# Patient Record
Sex: Female | Born: 1945 | Race: White | Hispanic: No | Marital: Married | Smoking: Former smoker
Health system: Southern US, Community
[De-identification: ages and names within clinical notes are randomized; demographics above are authoritative.]

## PROBLEM LIST (undated history)

## (undated) DIAGNOSIS — L900 Lichen sclerosus et atrophicus: Secondary | ICD-10-CM

## (undated) DIAGNOSIS — E785 Hyperlipidemia, unspecified: Secondary | ICD-10-CM

## (undated) DIAGNOSIS — R011 Cardiac murmur, unspecified: Secondary | ICD-10-CM

## (undated) DIAGNOSIS — E039 Hypothyroidism, unspecified: Secondary | ICD-10-CM

## (undated) DIAGNOSIS — E119 Type 2 diabetes mellitus without complications: Secondary | ICD-10-CM

## (undated) DIAGNOSIS — Z8614 Personal history of Methicillin resistant Staphylococcus aureus infection: Secondary | ICD-10-CM

## (undated) DIAGNOSIS — E2839 Other primary ovarian failure: Secondary | ICD-10-CM

## (undated) DIAGNOSIS — J45909 Unspecified asthma, uncomplicated: Secondary | ICD-10-CM

## (undated) DIAGNOSIS — M1990 Unspecified osteoarthritis, unspecified site: Secondary | ICD-10-CM

## (undated) DIAGNOSIS — I10 Essential (primary) hypertension: Secondary | ICD-10-CM

## (undated) DIAGNOSIS — R2681 Unsteadiness on feet: Secondary | ICD-10-CM

## (undated) DIAGNOSIS — N763 Subacute and chronic vulvitis: Secondary | ICD-10-CM

## (undated) DIAGNOSIS — E7800 Pure hypercholesterolemia, unspecified: Secondary | ICD-10-CM

## (undated) HISTORY — DX: Unspecified asthma, uncomplicated: J45.909

## (undated) HISTORY — DX: Unsteadiness on feet: R26.81

## (undated) HISTORY — DX: Other primary ovarian failure: E28.39

## (undated) HISTORY — DX: Lichen sclerosus et atrophicus: L90.0

## (undated) HISTORY — DX: Unspecified osteoarthritis, unspecified site: M19.90

## (undated) HISTORY — DX: Essential (primary) hypertension: I10

## (undated) HISTORY — DX: Hypothyroidism, unspecified: E03.9

## (undated) HISTORY — PX: CATARACT EXTRACTION: SUR2

## (undated) HISTORY — DX: Pure hypercholesterolemia, unspecified: E78.00

## (undated) HISTORY — DX: Hyperlipidemia, unspecified: E78.5

## (undated) HISTORY — DX: Personal history of Methicillin resistant Staphylococcus aureus infection: Z86.14

## (undated) HISTORY — DX: Type 2 diabetes mellitus without complications: E11.9

## (undated) HISTORY — PX: OTHER SURGICAL HISTORY: SHX169

## (undated) HISTORY — DX: Cardiac murmur, unspecified: R01.1

## (undated) HISTORY — DX: Subacute and chronic vulvitis: N76.3

## (undated) HISTORY — PX: PARTIAL HYSTERECTOMY: SHX80

---

## 2002-09-15 ENCOUNTER — Encounter: Admission: RE | Admit: 2002-09-15 | Discharge: 2002-12-14 | Payer: Self-pay | Admitting: *Deleted

## 2007-06-17 ENCOUNTER — Other Ambulatory Visit: Admission: RE | Admit: 2007-06-17 | Discharge: 2007-06-17 | Payer: Self-pay | Admitting: Obstetrics and Gynecology

## 2010-05-24 ENCOUNTER — Emergency Department (HOSPITAL_COMMUNITY)
Admission: EM | Admit: 2010-05-24 | Discharge: 2010-05-24 | Disposition: A | Discharge status: Home / Self Care | Payer: Medicare Other | Attending: Emergency Medicine | Admitting: Emergency Medicine

## 2010-05-24 ENCOUNTER — Emergency Department (HOSPITAL_COMMUNITY): Payer: Medicare Other

## 2010-05-24 DIAGNOSIS — IMO0002 Reserved for concepts with insufficient information to code with codable children: Secondary | ICD-10-CM | POA: Insufficient documentation

## 2010-05-24 DIAGNOSIS — E119 Type 2 diabetes mellitus without complications: Secondary | ICD-10-CM | POA: Insufficient documentation

## 2010-05-24 DIAGNOSIS — S62319A Displaced fracture of base of unspecified metacarpal bone, initial encounter for closed fracture: Secondary | ICD-10-CM | POA: Insufficient documentation

## 2010-05-24 DIAGNOSIS — M79609 Pain in unspecified limb: Secondary | ICD-10-CM | POA: Insufficient documentation

## 2010-05-24 DIAGNOSIS — Y9289 Other specified places as the place of occurrence of the external cause: Secondary | ICD-10-CM | POA: Insufficient documentation

## 2010-05-24 DIAGNOSIS — W1809XA Striking against other object with subsequent fall, initial encounter: Secondary | ICD-10-CM | POA: Insufficient documentation

## 2010-05-24 DIAGNOSIS — E7800 Pure hypercholesterolemia, unspecified: Secondary | ICD-10-CM | POA: Insufficient documentation

## 2010-05-24 DIAGNOSIS — I10 Essential (primary) hypertension: Secondary | ICD-10-CM | POA: Insufficient documentation

## 2011-11-05 IMAGING — CR DG HAND COMPLETE 3+V*L*
3 series · 3 of 3 positions shown · non-contrast
Comparison: None.

CLINICAL DATA: Trauma, fall, metacarpal pain

LEFT HAND - COMPLETE 3+ VIEW

[x hand ap left]
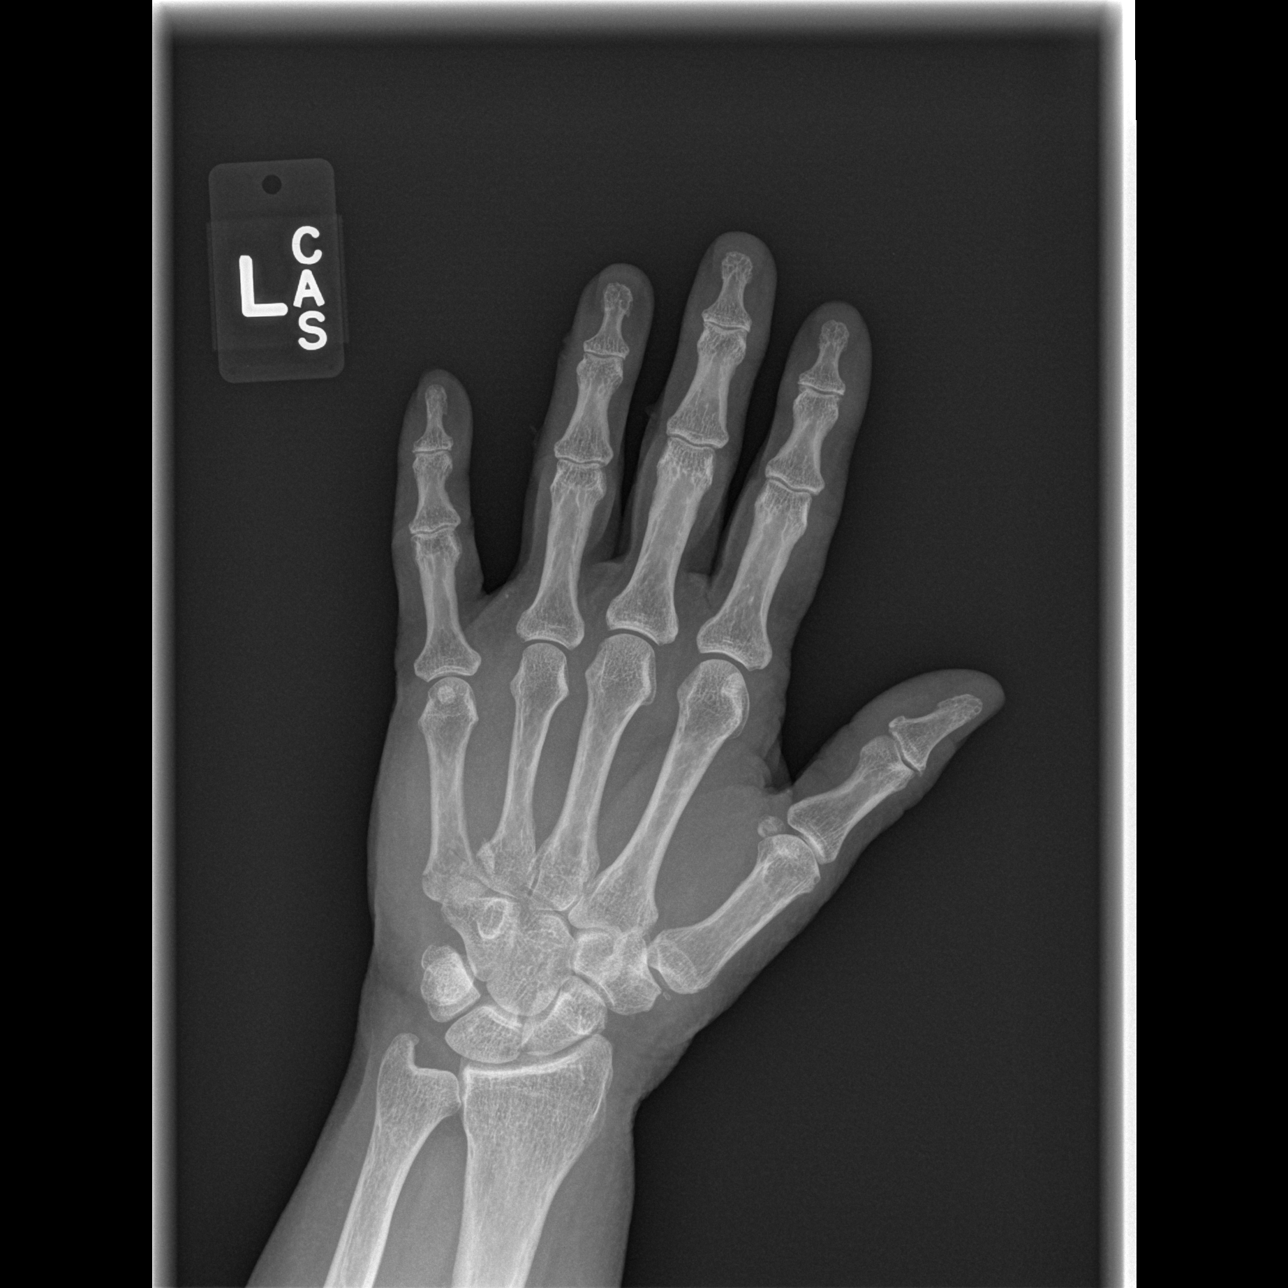

[x hand oblique left]
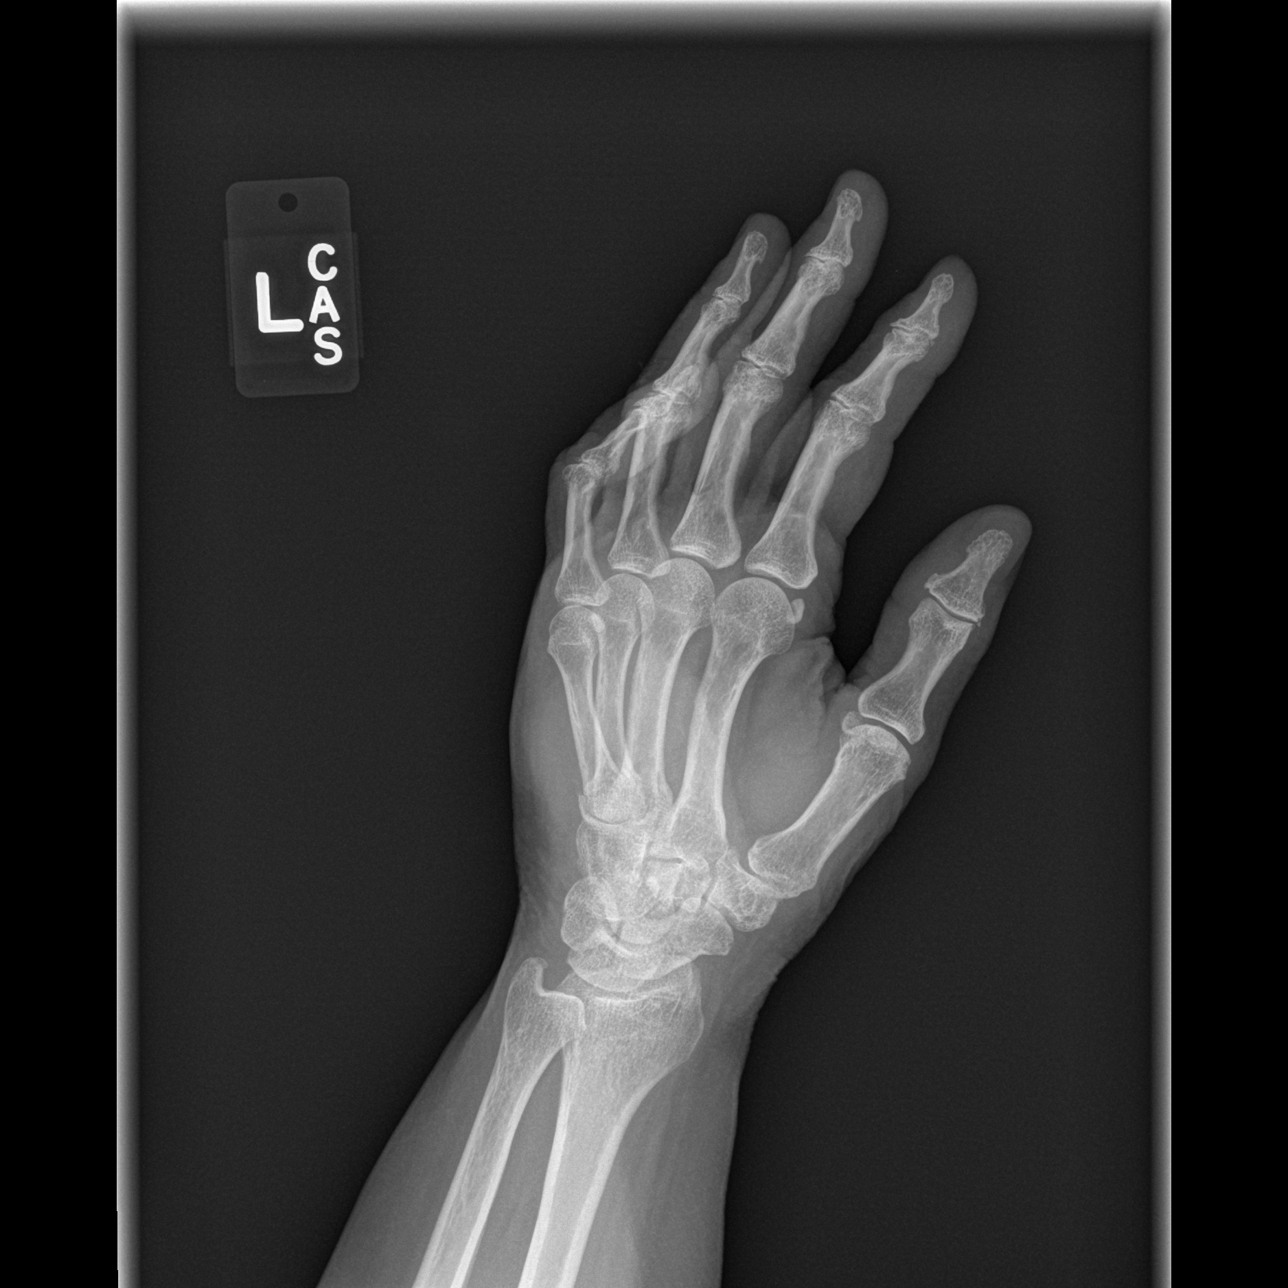

[x hand lat left]
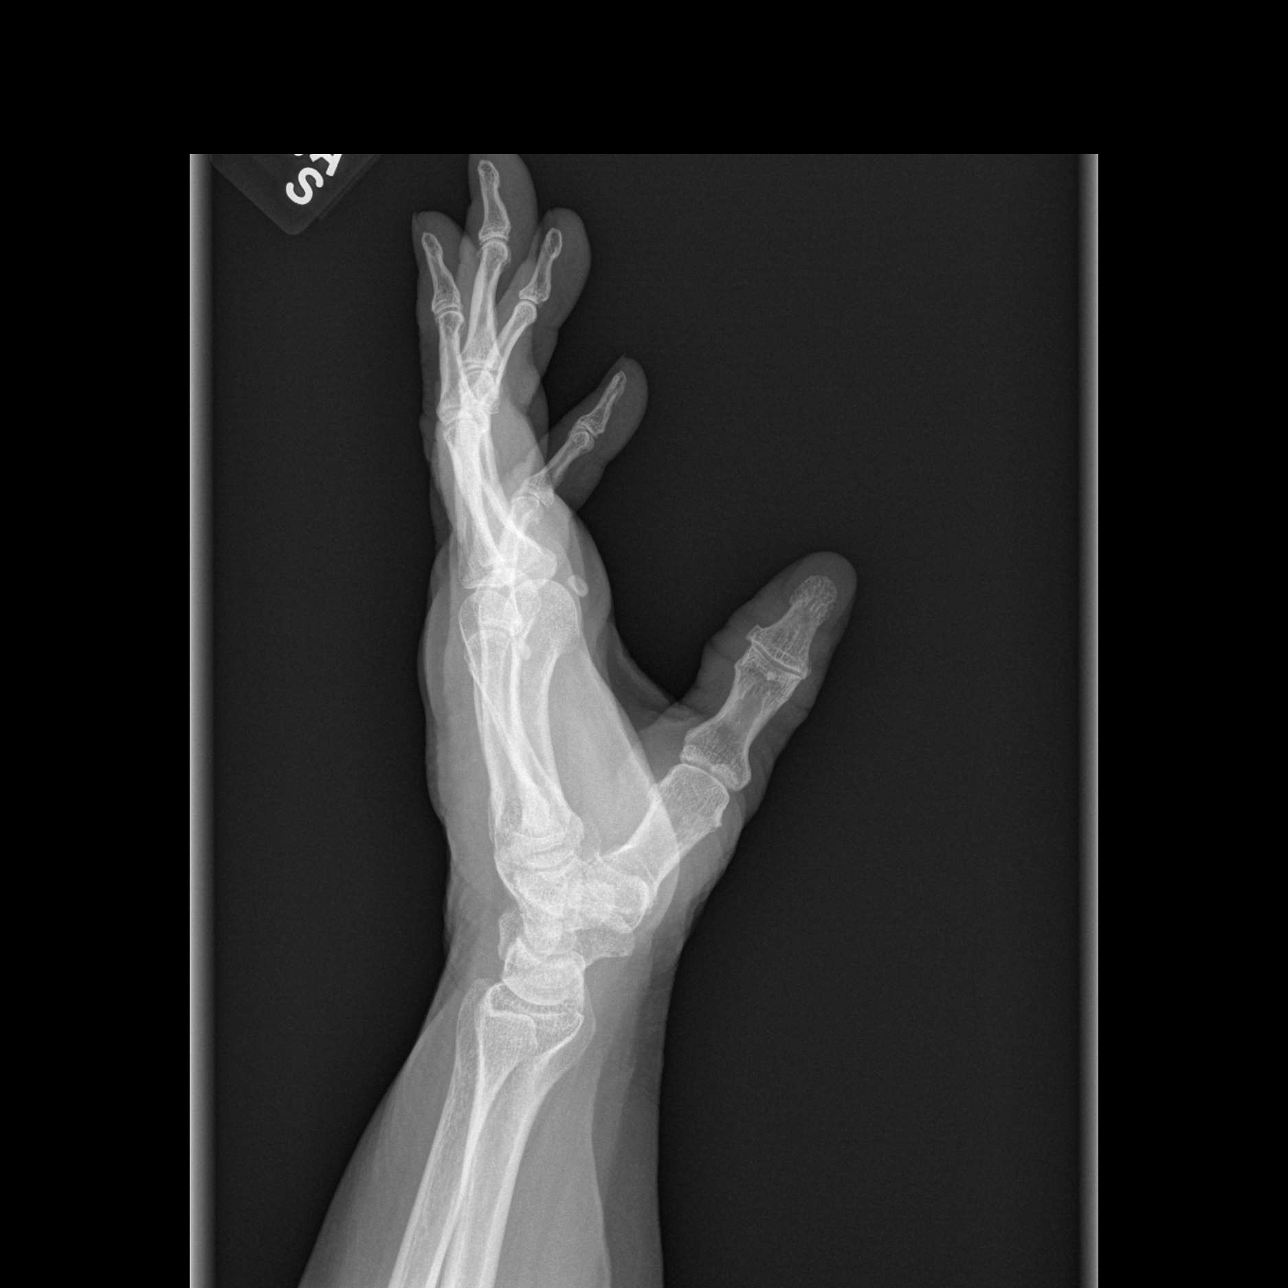

[3 of 3 positions shown; findings below may reference images not displayed]

FINDINGS: There is an acute fracture with minimal displacement of
the left fifth metacarpal proximally at its articulation to the
carpal bones.  Bones appear osteopenic.  No significant
degenerative change.  Swelling noted over the fracture.  No other
acute finding.
IMPRESSION: Proximal left fifth metacarpal fracture.

## 2016-10-23 ENCOUNTER — Telehealth: Payer: Self-pay

## 2016-10-23 NOTE — Telephone Encounter (Signed)
Sent notes to scheduling for appointment.

## 2016-10-29 ENCOUNTER — Telehealth (HOSPITAL_COMMUNITY): Payer: Self-pay | Admitting: Family Medicine

## 2016-10-31 NOTE — Telephone Encounter (Signed)
User: Allison Michael, [name] A Date/time: 10/29/16 10:09 AM  Comment: Called pt and lmsg for her to CB to get scheduled for an echo.   Context:  Outcome: Left Message  Phone number: [phone] Phone Type: Mobile  Comm. type: Telephone Call type: Outgoing  Contact: Nicodemus, Zacaria Relation to patient: Self

## 2016-11-04 ENCOUNTER — Other Ambulatory Visit: Payer: Self-pay

## 2016-11-04 ENCOUNTER — Other Ambulatory Visit: Payer: Self-pay | Admitting: Family Medicine

## 2016-11-04 ENCOUNTER — Ambulatory Visit (HOSPITAL_COMMUNITY): Payer: Medicare Other | Attending: Cardiology

## 2016-11-04 DIAGNOSIS — R011 Cardiac murmur, unspecified: Secondary | ICD-10-CM | POA: Insufficient documentation

## 2016-11-04 DIAGNOSIS — I081 Rheumatic disorders of both mitral and tricuspid valves: Secondary | ICD-10-CM | POA: Diagnosis not present

## 2016-11-04 DIAGNOSIS — I10 Essential (primary) hypertension: Secondary | ICD-10-CM | POA: Diagnosis not present

## 2017-11-05 ENCOUNTER — Other Ambulatory Visit: Payer: Self-pay | Admitting: Family Medicine

## 2017-11-05 DIAGNOSIS — R0989 Other specified symptoms and signs involving the circulatory and respiratory systems: Secondary | ICD-10-CM

## 2017-11-10 ENCOUNTER — Ambulatory Visit
Admission: RE | Admit: 2017-11-10 | Discharge: 2017-11-10 | Disposition: A | Discharge status: Home / Self Care | Payer: Medicare Other | Source: Ambulatory Visit | Attending: Family Medicine | Admitting: Family Medicine

## 2017-11-10 DIAGNOSIS — R0989 Other specified symptoms and signs involving the circulatory and respiratory systems: Secondary | ICD-10-CM

## 2019-02-12 ENCOUNTER — Ambulatory Visit: Payer: Medicare Other

## 2019-02-20 ENCOUNTER — Ambulatory Visit: Payer: Medicare PPO | Attending: Internal Medicine

## 2019-02-20 DIAGNOSIS — Z23 Encounter for immunization: Secondary | ICD-10-CM | POA: Insufficient documentation

## 2019-02-20 NOTE — Progress Notes (Signed)
   Covid-19 Vaccination Clinic  Name:  Allison Michael    MRN: [id number] DOB: [birth year]  02/20/2019  Allison Michael was observed post Covid-19 immunization for 15 minutes without incidence. She was provided with Vaccine Information Sheet and instruction to access the V-Safe system.   Allison Michael was instructed to call 911 with any severe reactions post vaccine: Marland Kitchen Difficulty breathing  . Swelling of your face and throat  . A fast heartbeat  . A bad rash all over your body  . Dizziness and weakness    Immunizations Administered    Name Date Dose VIS Date Route   Pfizer COVID-19 Vaccine 02/20/2019  4:01 PM 0.3 mL 12/25/2018 Intramuscular   Manufacturer: [employer], [employer]   Lot: [id number]   NDC: [address]-5

## 2019-02-23 ENCOUNTER — Ambulatory Visit: Payer: Medicare Other

## 2019-03-17 ENCOUNTER — Ambulatory Visit: Payer: Medicare PPO | Attending: Internal Medicine

## 2019-03-17 DIAGNOSIS — Z23 Encounter for immunization: Secondary | ICD-10-CM | POA: Insufficient documentation

## 2019-03-17 NOTE — Progress Notes (Signed)
   Covid-19 Vaccination Clinic  Name:  Marybelle Giraldo    MRN: [id number] DOB: July 19, 1945  03/17/2019  Ms. Saadeh was observed post Covid-19 immunization for 15 minutes without incident. She was provided with Vaccine Information Sheet and instruction to access the V-Safe system.   Ms. Mavis was instructed to call 911 with any severe reactions post vaccine: Marland Kitchen Difficulty breathing  . Swelling of face and throat  . A fast heartbeat  . A bad rash all over body  . Dizziness and weakness   Immunizations Administered    Name Date Dose VIS Date Route   Pfizer COVID-19 Vaccine 03/17/2019 12:07 PM 0.3 mL 12/25/2018 Intramuscular   Manufacturer: [employer], [employer]   Lot: [id number]   NDC: [address]-9    .

## 2019-09-01 DIAGNOSIS — Z1231 Encounter for screening mammogram for malignant neoplasm of breast: Secondary | ICD-10-CM | POA: Diagnosis not present

## 2019-10-06 DIAGNOSIS — I10 Essential (primary) hypertension: Secondary | ICD-10-CM | POA: Diagnosis not present

## 2019-10-06 DIAGNOSIS — E782 Mixed hyperlipidemia: Secondary | ICD-10-CM | POA: Diagnosis not present

## 2019-10-06 DIAGNOSIS — Z7984 Long term (current) use of oral hypoglycemic drugs: Secondary | ICD-10-CM | POA: Diagnosis not present

## 2019-10-06 DIAGNOSIS — E039 Hypothyroidism, unspecified: Secondary | ICD-10-CM | POA: Diagnosis not present

## 2019-10-06 DIAGNOSIS — E119 Type 2 diabetes mellitus without complications: Secondary | ICD-10-CM | POA: Diagnosis not present

## 2020-03-22 DIAGNOSIS — Z0000 Encounter for general adult medical examination without abnormal findings: Secondary | ICD-10-CM | POA: Diagnosis not present

## 2020-03-22 DIAGNOSIS — E039 Hypothyroidism, unspecified: Secondary | ICD-10-CM | POA: Diagnosis not present

## 2020-03-22 DIAGNOSIS — E782 Mixed hyperlipidemia: Secondary | ICD-10-CM | POA: Diagnosis not present

## 2020-03-22 DIAGNOSIS — E1169 Type 2 diabetes mellitus with other specified complication: Secondary | ICD-10-CM | POA: Diagnosis not present

## 2020-03-22 DIAGNOSIS — I10 Essential (primary) hypertension: Secondary | ICD-10-CM | POA: Diagnosis not present

## 2020-04-04 DIAGNOSIS — I10 Essential (primary) hypertension: Secondary | ICD-10-CM | POA: Diagnosis not present

## 2020-04-04 DIAGNOSIS — E119 Type 2 diabetes mellitus without complications: Secondary | ICD-10-CM | POA: Diagnosis not present

## 2020-04-04 DIAGNOSIS — E782 Mixed hyperlipidemia: Secondary | ICD-10-CM | POA: Diagnosis not present

## 2020-04-04 DIAGNOSIS — E039 Hypothyroidism, unspecified: Secondary | ICD-10-CM | POA: Diagnosis not present

## 2020-04-18 IMAGING — US US CAROTID DUPLEX BILAT
1 series · 13 of 24 positions shown · non-contrast
Comparison: None.

CLINICAL DATA: 72-year-old female with right carotid bruit

EXAM:
BILATERAL CAROTID DUPLEX ULTRASOUND
TECHNIQUE: Gray scale imaging, color Doppler and duplex ultrasound were
performed of bilateral carotid and vertebral arteries in the neck.

[Series 1: us carotid duplex bilat · 0.05mm/px · 13 of 44 slices shown]
[im 1/44]
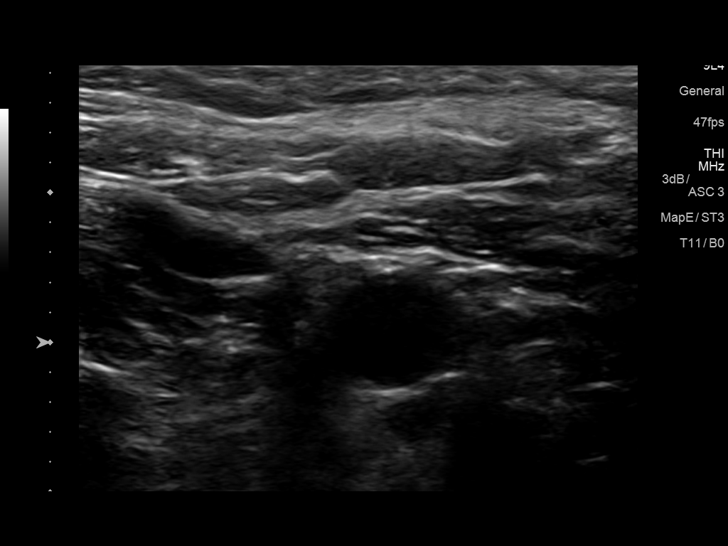
[im 4/44]
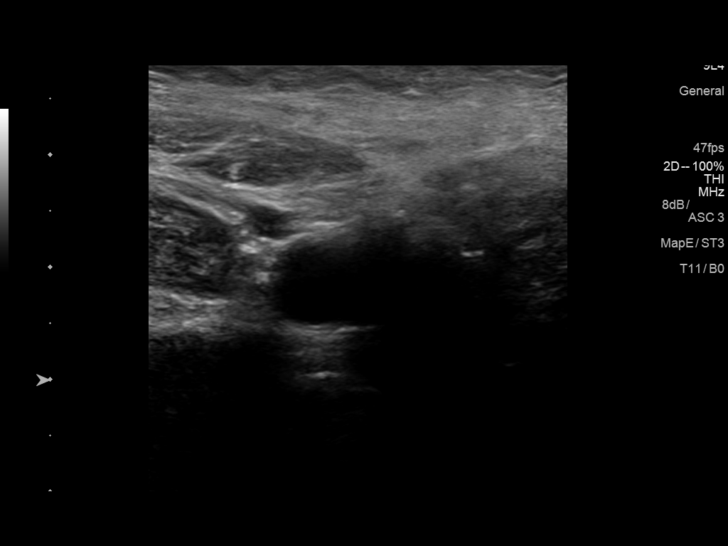
[im 8/44]
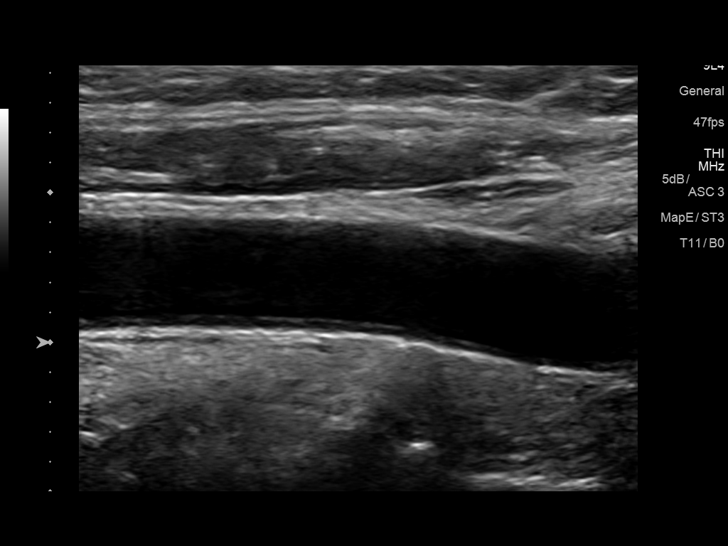
[im 12/44]
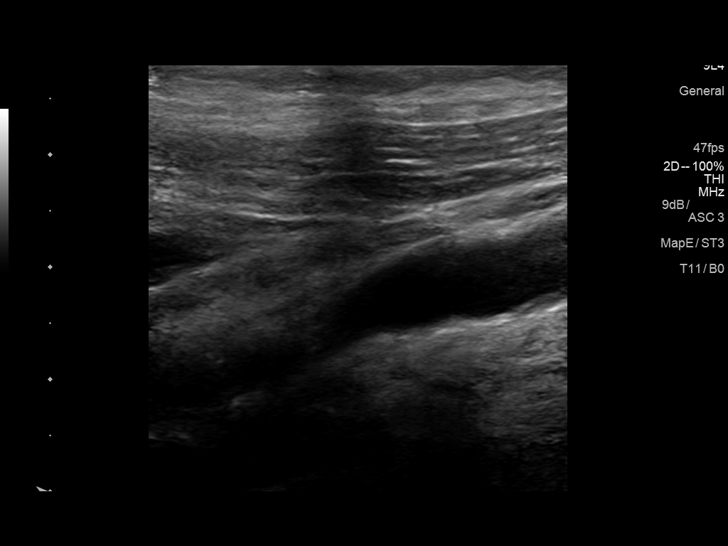
[im 15/44]
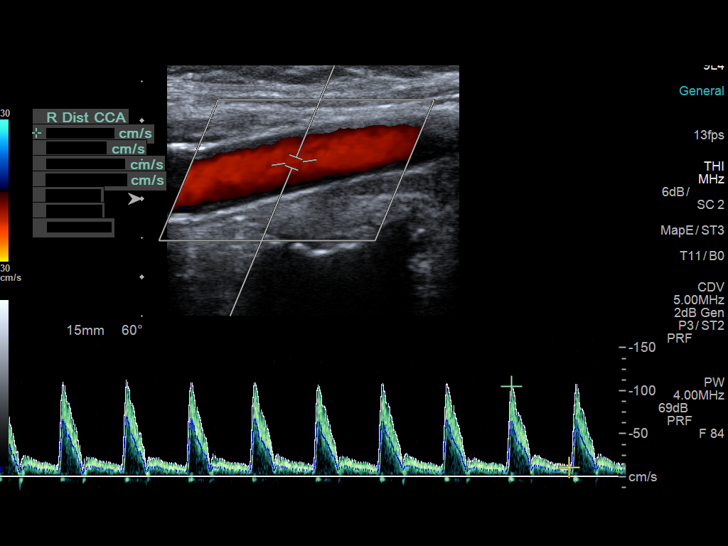
[im 19/44]
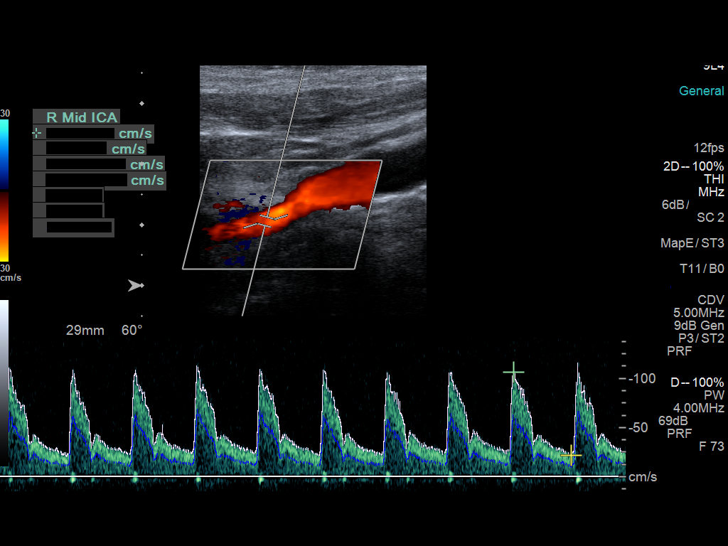
[im 23/44]
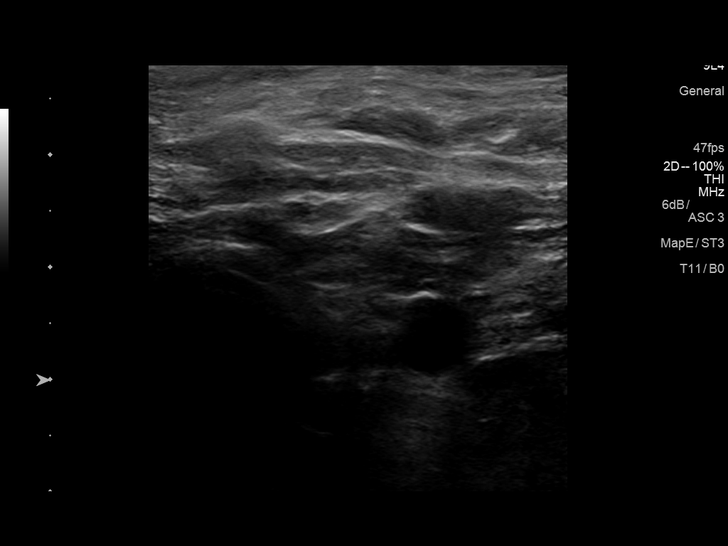
[im 25/44]
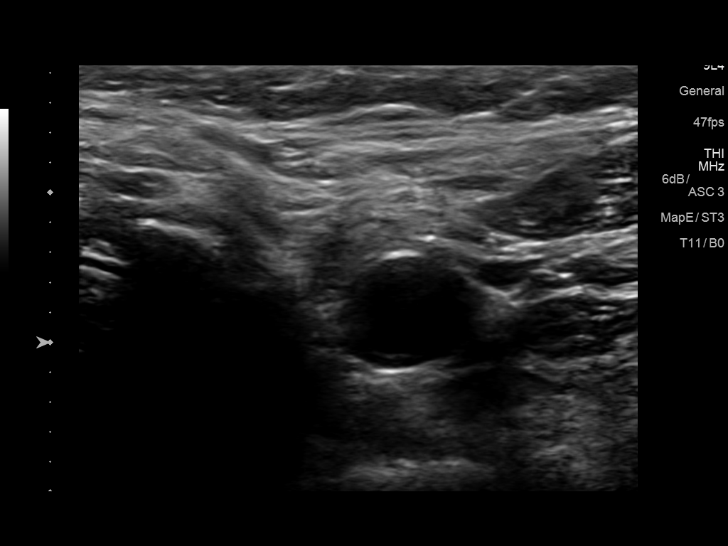
[im 29/44]
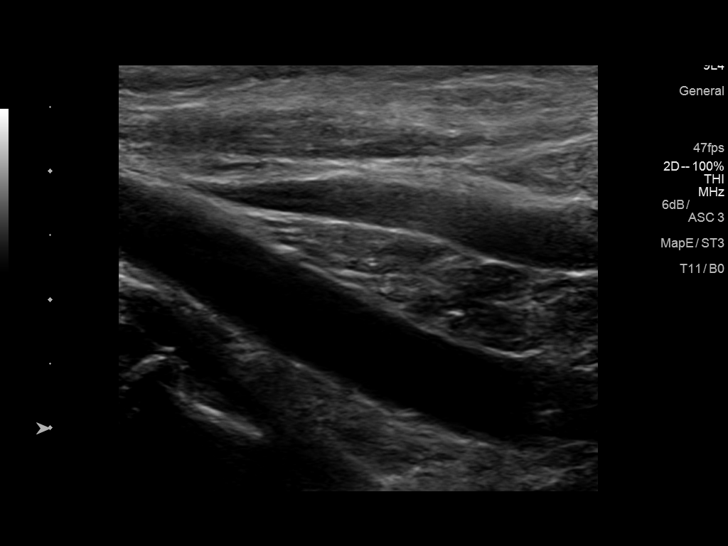
[im 32/44]
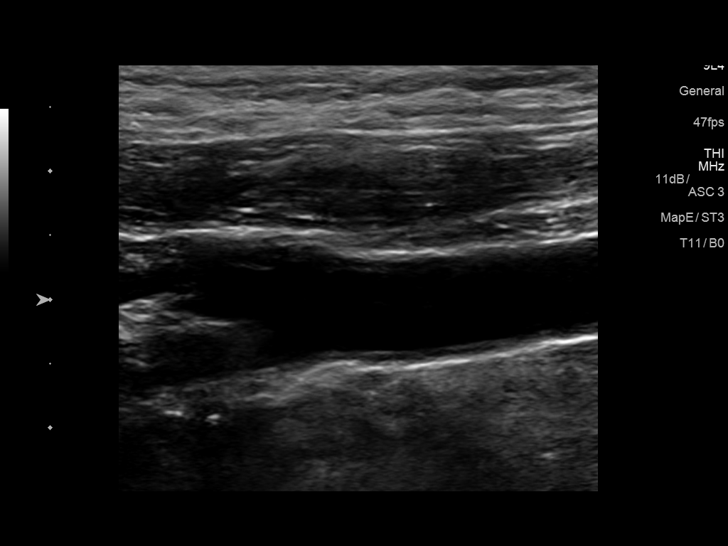
[im 36/44]
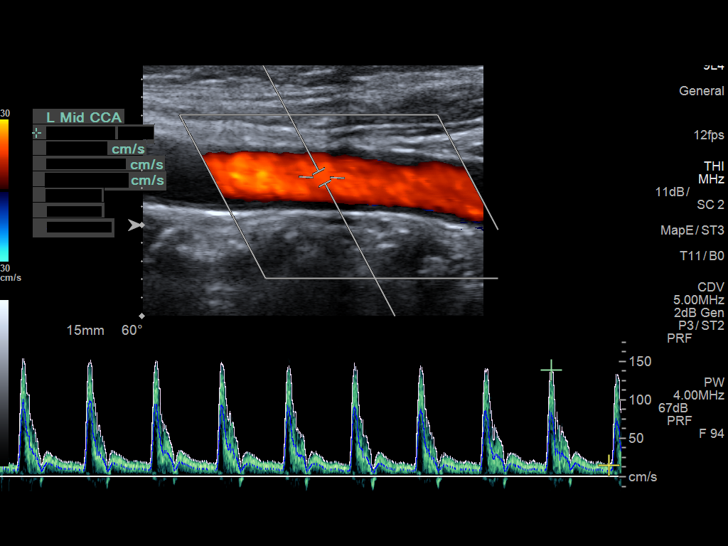
[im 40/44]
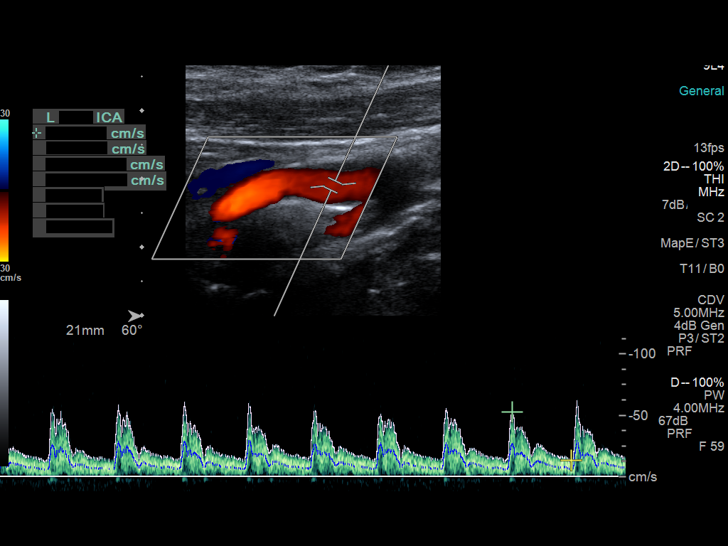
[im 44/44]
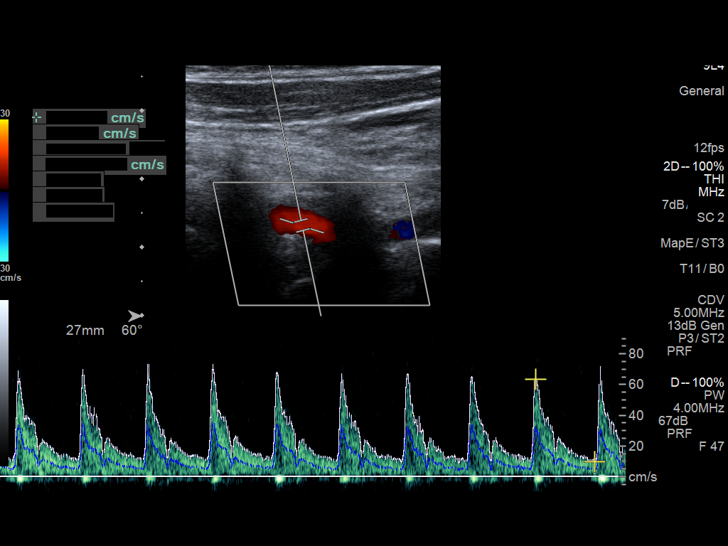

[13 of 24 positions shown; findings below may reference images not displayed]

FINDINGS: Criteria: Quantification of carotid stenosis is based on velocity
parameters that correlate the residual internal carotid diameter
with NASCET-based stenosis levels, using the diameter of the distal
internal carotid lumen as the denominator for stenosis measurement.

The following velocity measurements were obtained:

RIGHT
ICA: 106/22 cm/sec
CCA: 124/10 cm/sec

SYSTOLIC ICA/CCA RATIO:

ECA:  121 cm/sec

LEFT

ICA: 106/23 cm/sec

CCA: 147/11 cm/sec

SYSTOLIC ICA/CCA RATIO:

ECA:  157 cm/sec

RIGHT CAROTID ARTERY: No significant atherosclerotic plaque or
evidence of stenosis.

RIGHT VERTEBRAL ARTERY:  Patent with normal antegrade flow.

LEFT CAROTID ARTERY: No significant atherosclerotic plaque or
evidence of stenosis.

LEFT VERTEBRAL ARTERY:  Patent with normal antegrade flow.
IMPRESSION: 1. No significant atherosclerotic plaque or evidence of stenosis in
either internal carotid artery.
2. Vertebral arteries are patent with normal antegrade flow.

## 2020-05-08 DIAGNOSIS — H40023 Open angle with borderline findings, high risk, bilateral: Secondary | ICD-10-CM | POA: Diagnosis not present

## 2020-05-08 DIAGNOSIS — Z961 Presence of intraocular lens: Secondary | ICD-10-CM | POA: Diagnosis not present

## 2020-05-08 DIAGNOSIS — E119 Type 2 diabetes mellitus without complications: Secondary | ICD-10-CM | POA: Diagnosis not present

## 2020-09-06 DIAGNOSIS — Z1231 Encounter for screening mammogram for malignant neoplasm of breast: Secondary | ICD-10-CM | POA: Diagnosis not present

## 2020-09-06 DIAGNOSIS — Z803 Family history of malignant neoplasm of breast: Secondary | ICD-10-CM | POA: Diagnosis not present

## 2020-09-06 DIAGNOSIS — Z780 Asymptomatic menopausal state: Secondary | ICD-10-CM | POA: Diagnosis not present

## 2020-09-21 DIAGNOSIS — R922 Inconclusive mammogram: Secondary | ICD-10-CM | POA: Diagnosis not present

## 2020-10-05 DIAGNOSIS — E119 Type 2 diabetes mellitus without complications: Secondary | ICD-10-CM | POA: Diagnosis not present

## 2020-10-05 DIAGNOSIS — I10 Essential (primary) hypertension: Secondary | ICD-10-CM | POA: Diagnosis not present

## 2020-10-05 DIAGNOSIS — E039 Hypothyroidism, unspecified: Secondary | ICD-10-CM | POA: Diagnosis not present

## 2020-10-05 DIAGNOSIS — E782 Mixed hyperlipidemia: Secondary | ICD-10-CM | POA: Diagnosis not present

## 2020-10-05 DIAGNOSIS — Z7984 Long term (current) use of oral hypoglycemic drugs: Secondary | ICD-10-CM | POA: Diagnosis not present

## 2021-01-24 DIAGNOSIS — M1990 Unspecified osteoarthritis, unspecified site: Secondary | ICD-10-CM | POA: Diagnosis not present

## 2021-04-05 DIAGNOSIS — I10 Essential (primary) hypertension: Secondary | ICD-10-CM | POA: Diagnosis not present

## 2021-04-05 DIAGNOSIS — E782 Mixed hyperlipidemia: Secondary | ICD-10-CM | POA: Diagnosis not present

## 2021-04-05 DIAGNOSIS — E119 Type 2 diabetes mellitus without complications: Secondary | ICD-10-CM | POA: Diagnosis not present

## 2021-04-05 DIAGNOSIS — E039 Hypothyroidism, unspecified: Secondary | ICD-10-CM | POA: Diagnosis not present

## 2021-05-02 DIAGNOSIS — M25511 Pain in right shoulder: Secondary | ICD-10-CM | POA: Diagnosis not present

## 2021-05-09 DIAGNOSIS — E119 Type 2 diabetes mellitus without complications: Secondary | ICD-10-CM | POA: Diagnosis not present

## 2021-05-09 DIAGNOSIS — Z961 Presence of intraocular lens: Secondary | ICD-10-CM | POA: Diagnosis not present

## 2021-05-09 DIAGNOSIS — H40013 Open angle with borderline findings, low risk, bilateral: Secondary | ICD-10-CM | POA: Diagnosis not present

## 2021-05-23 DIAGNOSIS — E119 Type 2 diabetes mellitus without complications: Secondary | ICD-10-CM | POA: Diagnosis not present

## 2021-05-23 DIAGNOSIS — I10 Essential (primary) hypertension: Secondary | ICD-10-CM | POA: Diagnosis not present

## 2021-05-23 DIAGNOSIS — E039 Hypothyroidism, unspecified: Secondary | ICD-10-CM | POA: Diagnosis not present

## 2021-05-23 DIAGNOSIS — E7800 Pure hypercholesterolemia, unspecified: Secondary | ICD-10-CM | POA: Diagnosis not present

## 2021-05-23 DIAGNOSIS — R011 Cardiac murmur, unspecified: Secondary | ICD-10-CM | POA: Diagnosis not present

## 2021-05-23 DIAGNOSIS — Z0000 Encounter for general adult medical examination without abnormal findings: Secondary | ICD-10-CM | POA: Diagnosis not present

## 2021-05-28 ENCOUNTER — Telehealth: Payer: Self-pay

## 2021-05-28 NOTE — Telephone Encounter (Signed)
NOTE SCANNED TO REFERRAL ?

## 2021-07-02 ENCOUNTER — Encounter: Payer: Self-pay | Admitting: Cardiovascular Disease

## 2021-07-02 ENCOUNTER — Ambulatory Visit: Payer: Medicare PPO | Admitting: Cardiovascular Disease

## 2021-07-02 VITALS — BP 160/70 | HR 89 | Ht 61.0 in | Wt 188.2 lb

## 2021-07-02 DIAGNOSIS — Z79899 Other long term (current) drug therapy: Secondary | ICD-10-CM

## 2021-07-02 DIAGNOSIS — I340 Nonrheumatic mitral (valve) insufficiency: Secondary | ICD-10-CM | POA: Diagnosis not present

## 2021-07-02 DIAGNOSIS — I350 Nonrheumatic aortic (valve) stenosis: Secondary | ICD-10-CM | POA: Diagnosis not present

## 2021-07-02 MED ORDER — HYDROCHLOROTHIAZIDE 25 MG PO TABS: 25.0000 mg | ORAL_TABLET | Freq: Every day | ORAL | 3 refills | Status: DC

## 2021-07-02 NOTE — Patient Instructions (Signed)
Medication Instructions:  START Hydrochlorothiazide 25mg  daily *If you need a refill on your cardiac medications before your next appointment, please call your pharmacy*   Lab Work: BMET in 3 weeks (at nurse visit) If you have labs (blood work) drawn today and your tests are completely normal, you will receive your results only by: MyChart Message (if you have MyChart) OR A paper copy in the mail If you have any lab test that is abnormal or we need to change your treatment, we will call you to review the results.   Testing/Procedures: ECHO Your physician has requested that you have an echocardiogram. Echocardiography is a painless test that uses sound waves to create images of your heart. It provides your doctor with information about the size and shape of your heart and how well your heart's chambers and valves are working. This procedure takes approximately one hour. There are no restrictions for this procedure.  Nurse visit in 3 weeks for BP check (BMET same day)   Follow-Up: At [facility], you and your health needs are our priority.  As part of our continuing mission to provide you with exceptional heart care, we have created designated Provider Care Teams.  These Care Teams include your primary Cardiologist (physician) and Advanced Practice Providers (APPs -  Physician Assistants and Nurse Practitioners) who all work together to provide you with the care you need, when you need it.   Your next appointment:   3 month(s)  The format for your next appointment:   In Person  Provider:1}  Nahser  Important Information About Sugar

## 2021-07-02 NOTE — Progress Notes (Signed)
Cardiology Office Note:    Date:  07/02/2021   ID:  Allison Michael, DOB [birth year], MRN [id number]  PCP:  Juluis Rainier, MD (Inactive)   [facility] Providers Cardiologist:  Ilyas Lipsitz    Referring MD: Clayborn Heron, MD   Chief Complaint  Patient presents with   Heart Murmur    History of Present Illness:    Allison Michael is a 76 y.o. female with a hx of murmur , HTN, HLD Has not been taking her BP regularly .  Watches her salt  Mostly watches her salt   Tries to exercise, Stays active Has been having some issues in her rotator cuff - does not sleep well due to the pain   Past Medical History:  Diagnosis Date   Asthma    Chronic vulvitis    DM (diabetes mellitus) (HCC)    Estrogen deficiency    Gait instability    Heart murmur    History of MRSA infection    HTN (hypertension)    Hypercholesteremia    Hyperlipidemia    Hypothyroidism    Lichen sclerosus    Osteoarthritis    Personal history of Methicillin resistant Staphylococcus aureus infection     Past Surgical History:  Procedure Laterality Date   CATARACT EXTRACTION Bilateral    CHOLECYTECTOMY     PARTIAL HYSTERECTOMY      Current Medications: Current Meds  Medication Sig   acetaminophen (TYLENOL) 500 MG tablet Take 500 mg by mouth every 6 (six) hours as needed.   amLODipine (NORVASC) 10 MG tablet Take 10 mg by mouth daily.   aspirin EC 81 MG tablet Take 81 mg by mouth daily. Swallow whole.   atorvastatin (LIPITOR) 40 MG tablet Take 40 mg by mouth daily.   Dulaglutide (TRULICITY) 3 MG/0.5ML SOPN Inject into the skin.   empagliflozin (JARDIANCE) 25 MG TABS tablet Take by mouth daily.   Fluticasone Furoate 50 MCG/ACT AEPB Inhale into the lungs.   levothyroxine (SYNTHROID) 75 MCG tablet Take 75 mcg by mouth daily before breakfast.   losartan (COZAAR) 100 MG tablet Take 100 mg by mouth daily.   miconazole (MICOTIN) 2 % cream Apply 1 application  topically 2 (two) times daily.   Multiple  Vitamins-Minerals (CENTRUM SILVER PO) Take by mouth.   naproxen sodium (ALEVE) 220 MG tablet Take 220 mg by mouth.   nystatin-triamcinolone (MYCOLOG II) cream Apply 1 application  topically 2 (two) times daily.   Potassium Chloride ER 20 MEQ TBCR Take 1 tablet by mouth daily.     Allergies:   Patient has no allergy information on record.   Social History   Socioeconomic History   Marital status: Married    Spouse name: Not on file   Number of children: Not on file   Years of education: Not on file   Highest education level: Not on file  Occupational History   Not on file  Tobacco Use   Smoking status: Former    Types: Cigarettes   Smokeless tobacco: Never  Substance and Sexual Activity   Alcohol use: Not on file   Drug use: Not on file   Sexual activity: Not on file  Other Topics Concern   Not on file  Social History Narrative   Not on file   Social Determinants of Health   Financial Resource Strain: Not on file  Food Insecurity: Not on file  Transportation Needs: Not on file  Physical Activity: Not on file  Stress: Not on file  Social  Connections: Not on file     Family History: The patient's family history includes CAD in her brother; Diabetes in her father; Heart attack in her brother; Heart disease in her father and mother; Hypertension in her father; Stomach cancer in her mother; Uterine cancer in her mother.  ROS:   Please see the history of present illness.     All other systems reviewed and are negative.  EKGs/Labs/Other Studies Reviewed:    The following studies were reviewed today:   EKG:     Recent Labs: No results found for requested labs within last 365 days.  Recent Lipid Panel No results found for: "CHOL", "TRIG", "HDL", "CHOLHDL", "VLDL", "LDLCALC", "LDLDIRECT"   Risk Assessment/Calculations:           Physical Exam:    VS:  Pulse 89   Ht 5\' 1"  (1.549 m)   Wt 188 lb 3.2 oz (85.4 kg)   SpO2 98%   BMI 35.56 kg/m     Wt Readings  from Last 3 Encounters:  07/02/21 188 lb 3.2 oz (85.4 kg)     GEN: midly obese , elderly female,  well developed in no acute distress HEENT: Normal NECK: No JVD; she has radiation of her systolic murmur up into her right carotid. LYMPHATICS: No lymphadenopathy CARDIAC: Regular rate S1-S2.  She has a 2/6 systolic murmur radiating to the upper right sternal border and into her carotid.  She also has a systolic murmur that radiates out to her left axilla. RESPIRATORY:  Clear to auscultation without rales, wheezing or rhonchi  ABDOMEN: Soft, non-tender, non-distended MUSCULOSKELETAL:  No edema; No deformity  SKIN: Warm and dry NEUROLOGIC:  Alert and oriented x 3 PSYCHIATRIC:  Normal affect   ASSESSMENT:    No diagnosis found. PLAN:    In order of problems listed above:  Murmur : Care presents with 3 different heart murmurs.  She has an aortic stenosis murmur, mitral vegetation murmur and a tricuspid regurgitation murmur.  We will get an echocardiogram for further evaluation of this.  Her last echocardiogram in 2018 showed mild MR and mild TR.  It did not mention any abnormality of the aortic valve but I would suspect that we will find some calcification or sclerosis and perhaps aortic stenosis.  2.  Chronic diastolic CHF: She had grade 1 diastolic CHF at her last echo.  I encouraged her to work on diet, exercise, weight loss.  Also better control of her blood pressure will help with this.  3.  Hypertension: Blood pressure is elevated today.  We will add HCTZ 25 mg a day.  We will have her return for nurse visit in 2 to [redacted] weeks along with a basic metabolic profile.  If her blood pressure remains elevated we will consider adding Bystolic at that time.           Medication Adjustments/Labs and Tests Ordered: Current medicines are reviewed at length with the patient today.  Concerns regarding medicines are outlined above.  No orders of the defined types were placed in this  encounter.  No orders of the defined types were placed in this encounter.   There are no Patient Instructions on file for this visit.   Signed, 2019, MD  07/02/2021 8:58 AM    [facility]

## 2021-07-26 ENCOUNTER — Other Ambulatory Visit: Payer: Medicare PPO

## 2021-07-26 ENCOUNTER — Ambulatory Visit (HOSPITAL_COMMUNITY): Payer: Medicare PPO | Attending: Internal Medicine

## 2021-07-26 ENCOUNTER — Ambulatory Visit: Payer: Medicare PPO

## 2021-07-26 DIAGNOSIS — I340 Nonrheumatic mitral (valve) insufficiency: Secondary | ICD-10-CM

## 2021-07-26 DIAGNOSIS — Z5181 Encounter for therapeutic drug level monitoring: Secondary | ICD-10-CM | POA: Diagnosis not present

## 2021-07-26 DIAGNOSIS — I10 Essential (primary) hypertension: Secondary | ICD-10-CM | POA: Diagnosis not present

## 2021-07-26 DIAGNOSIS — I350 Nonrheumatic aortic (valve) stenosis: Secondary | ICD-10-CM

## 2021-07-26 DIAGNOSIS — Z79899 Other long term (current) drug therapy: Secondary | ICD-10-CM

## 2021-07-26 LAB — ECHOCARDIOGRAM COMPLETE
AR max vel: 1.31 cm2
AV Area VTI: 1.3 cm2
AV Area mean vel: 1.25 cm2
AV Mean grad: 12.2 mmHg
AV Peak grad: 21.2 mmHg
Ao pk vel: 2.3 m/s
Area-P 1/2: 3.37 cm2
S' Lateral: 2.3 cm

## 2021-07-26 LAB — BASIC METABOLIC PANEL
BUN/Creatinine Ratio: 19 (ref 12–28)
BUN: 15 mg/dL (ref 8–27)
CO2: 21 mmol/L (ref 20–29)
Calcium: 10 mg/dL (ref 8.7–10.3)
Chloride: 99 mmol/L (ref 96–106)
Creatinine, Ser: 0.78 mg/dL (ref 0.57–1.00)
Glucose: 217 mg/dL — ABNORMAL HIGH (ref 70–99)
Potassium: 4.4 mmol/L (ref 3.5–5.2)
Sodium: 137 mmol/L (ref 134–144)
eGFR: 79 mL/min/{1.73_m2} (ref >59)

## 2021-07-26 NOTE — Patient Instructions (Signed)
Medication Instructions:  Your physician recommends that you continue on your current medications as directed. Please refer to the Current Medication list given to you today. Will talk with Dr Elease Hashimoto on Friday 07/27/21.   *If you need a refill on your cardiac medications before your next appointment, please call your pharmacy*   Lab Work: BMET done today  If you have labs (blood work) drawn today and your tests are completely normal, you will receive your results only by: MyChart Message (if you have MyChart) OR A paper copy in the mail If you have any lab test that is abnormal or we need to change your treatment, we will call you to review the results.   Testing/Procedures:    Follow-Up: At [facility], you and your health needs are our priority.  As part of our continuing mission to provide you with exceptional heart care, we have created designated Provider Care Teams.  These Care Teams include your primary Cardiologist (physician) and Advanced Practice Providers (APPs -  Physician Assistants and Nurse Practitioners) who all work together to provide you with the care you need, when you need it.  We recommend signing up for the patient portal called "MyChart".  Sign up information is provided on this After Visit Summary.  MyChart is used to connect with patients for Virtual Visits (Telemedicine).  Patients are able to view lab/test results, encounter notes, upcoming appointments, etc.  Non-urgent messages can be sent to your provider as well.   To learn more about what you can do with MyChart, go to [email or web address].     Important Information About Sugar

## 2021-07-26 NOTE — Progress Notes (Signed)
   Nurse Visit   Date of Encounter: 07/26/2021 ID: Allison Michael, DOB 06-02-1945, MRN [id number]  PCP:  Juluis Rainier, MD (Inactive)   [facility] Providers Cardiologist:  Kristeen Miss, MD {   Visit Details   VS:  BP 140/78 (BP Location: Left Arm, Patient Position: Sitting, Cuff Size: Normal)   Pulse 88  , BMI There is no height or weight on file to calculate BMI.  Wt Readings from Last 3 Encounters:  07/02/21 188 lb 3.2 oz (85.4 kg)     Reason for visit: BP Check 2 weeks after starting HCTZ Performed today: Vitals Changes (medications, testing, etc.) : no changes today  Length of Visit: 15 minutes  Pt says she has been feeling well... her BP today 140/78 and HR 88... I have talked with Dr Ladona Ridgel and will plan for Dr Elease Hashimoto to review when he is here in the office... he has a plan in place for her BP management as noted at her last OV. She had an Echo and BMET today.   Pt is asking if Dr Elease Hashimoto can send in all of her Cardio meds for profile so that she can refill easily and will be sent to our office for refills in the future since as of now her refill requests are going to her PCP that has retired. I advised her that we will wait until tomorrow after he has a chance to determine if he is making any changes.   Pt also asking if Dr Elease Hashimoto can suggest a new PCP in the area for her and her husband... she prefers a female and he prefers a female... I advised her that I will forward to Dr Elease Hashimoto for his recommendations.      Medications Adjustments/Labs and Tests Ordered: No orders of the defined types were placed in this encounter.  No orders of the defined types were placed in this encounter.    Signed, Bertram Millard, RN  07/26/2021 10:59 AM

## 2021-07-31 ENCOUNTER — Telehealth: Payer: Self-pay

## 2021-07-31 MED ORDER — POTASSIUM CHLORIDE ER 20 MEQ PO TBCR: 1.0000 | EXTENDED_RELEASE_TABLET | Freq: Every day | ORAL | 3 refills | Status: DC

## 2021-07-31 MED ORDER — AMLODIPINE BESYLATE 10 MG PO TABS: 10.0000 mg | ORAL_TABLET | Freq: Every day | ORAL | 3 refills | Status: DC

## 2021-07-31 MED ORDER — NEBIVOLOL HCL 2.5 MG PO TABS: 2.5000 mg | ORAL_TABLET | Freq: Every day | ORAL | 3 refills | Status: DC

## 2021-07-31 MED ORDER — LOSARTAN POTASSIUM 100 MG PO TABS: 100.0000 mg | ORAL_TABLET | Freq: Every day | ORAL | 3 refills | Status: DC

## 2021-07-31 MED ORDER — ATORVASTATIN CALCIUM 40 MG PO TABS: 40.0000 mg | ORAL_TABLET | Freq: Every day | ORAL | 3 refills | Status: DC

## 2021-07-31 NOTE — Telephone Encounter (Signed)
Left message for patient. Asked that she call office with questions. Medication sent to [facility] on file.

## 2021-07-31 NOTE — Telephone Encounter (Signed)
-----   Message from Philip J Nahser, MD sent at 07/27/2021  5:35 PM EDT ----- Lets add Bystolic 2.5 mg a day  She should continue to monitor her BP   PN  ----- Message ----- From: Bourn, Ann M, RN Sent: 07/26/2021  11:09 AM EDT To: Gregg W Taylor, MD; Philip J Nahser, MD   

## 2021-07-31 NOTE — Telephone Encounter (Signed)
-----   Message from Vesta Mixer, MD sent at 07/27/2021  5:35 PM EDT ----- Joen Laura add Bystolic 2.5 mg a day  She should continue to monitor her BP   PN  ----- Message ----- From: Bertram Millard, RN Sent: 07/26/2021  11:09 AM EDT To: Marinus Maw, MD; Vesta Mixer, MD

## 2021-07-31 NOTE — Telephone Encounter (Signed)
Per note by Jeannett Senior on 07/26/21 at nurse visit:  Pt is asking if Dr Elease Hashimoto can send in all of her Cardio meds for profile so that she can refill easily and will be sent to our office for refills in the future since as of now her refill requests are going to her PCP that has retired.   Dr Elease Hashimoto is okay with this. Refilling Norvasc, lipitor, potassium, and losartan at this time. Sent to [facility] on file.

## 2021-10-01 ENCOUNTER — Encounter: Payer: Self-pay | Admitting: Cardiovascular Disease

## 2021-10-01 NOTE — Progress Notes (Unsigned)
Cardiology Office Note:    Date:  10/02/2021   ID:  Allison Michael, DOB [birth year], MRN [id number]  PCP:  Juluis Rainier, MD (Inactive)   [facility] Providers Cardiologist:  Dejah Droessler    Referring MD: No ref. provider found   Chief Complaint  Patient presents with   Heart Murmur   Hypertension         History of Present Illness:    Allison Michael is a 76 y.o. female with a hx of murmur , HTN, HLD Has not been taking her BP regularly .  Watches her salt  Mostly watches her salt   Tries to exercise, Stays active Has been having some issues in her rotator cuff - does not sleep well due to the pain   Sept. 19, 2023 Allison Michael is seen today for follow up of her hear murmur, HTN, HLD  Echo :  normal LV systolic function, grade I DD, mild AI  She is now going to the [facility] and swims for an hour a day 3 days a week . Is eating better   Wt is 181 lbs ( down 7 lbs )   Dr. Sharl Ma manages her lipids .    Past Medical History:  Diagnosis Date   Asthma    Chronic vulvitis    DM (diabetes mellitus) (HCC)    Estrogen deficiency    Gait instability    Heart murmur    History of MRSA infection    HTN (hypertension)    Hypercholesteremia    Hyperlipidemia    Hypothyroidism    Lichen sclerosus    Osteoarthritis    Personal history of Methicillin resistant Staphylococcus aureus infection     Past Surgical History:  Procedure Laterality Date   CATARACT EXTRACTION Bilateral    CHOLECYTECTOMY     PARTIAL HYSTERECTOMY      Current Medications: Current Meds  Medication Sig   ACCU-CHEK GUIDE test strip daily.   acetaminophen (TYLENOL) 500 MG tablet Take 500 mg by mouth every 6 (six) hours as needed.   amLODipine (NORVASC) 10 MG tablet Take 1 tablet (10 mg total) by mouth daily.   aspirin EC 81 MG tablet Take 81 mg by mouth daily. Swallow whole.   atorvastatin (LIPITOR) 40 MG tablet Take 1 tablet (40 mg total) by mouth daily.   Dulaglutide (TRULICITY) 3 MG/0.5ML SOPN Inject  into the skin.   empagliflozin (JARDIANCE) 25 MG TABS tablet Take by mouth daily.   Fluticasone Furoate 50 MCG/ACT AEPB Inhale into the lungs.   hydrochlorothiazide (HYDRODIURIL) 25 MG tablet Take 1 tablet (25 mg total) by mouth daily.   levothyroxine (SYNTHROID) 75 MCG tablet Take 75 mcg by mouth daily before breakfast.   losartan (COZAAR) 100 MG tablet Take 1 tablet (100 mg total) by mouth daily.   miconazole (MICOTIN) 2 % cream Apply 1 application  topically 2 (two) times daily.   Multiple Vitamins-Minerals (CENTRUM SILVER PO) Take by mouth.   naproxen sodium (ALEVE) 220 MG tablet Take 220 mg by mouth.   nystatin-triamcinolone (MYCOLOG II) cream Apply 1 application  topically 2 (two) times daily.   Potassium Chloride ER 20 MEQ TBCR Take 1 tablet by mouth daily.   [DISCONTINUED] nebivolol (BYSTOLIC) 2.5 MG tablet Take 1 tablet (2.5 mg total) by mouth daily.     Allergies:   Patient has no allergy information on record.   Social History   Socioeconomic History   Marital status: Married    Spouse name: Not on file  Number of children: Not on file   Years of education: Not on file   Highest education level: Not on file  Occupational History   Not on file  Tobacco Use   Smoking status: Former    Types: Cigarettes   Smokeless tobacco: Never  Substance and Sexual Activity   Alcohol use: Not on file   Drug use: Not on file   Sexual activity: Not on file  Other Topics Concern   Not on file  Social History Narrative   Not on file   Social Determinants of Health   Financial Resource Strain: Not on file  Food Insecurity: Not on file  Transportation Needs: Not on file  Physical Activity: Not on file  Stress: Not on file  Social Connections: Not on file     Family History: The patient's family history includes CAD in her brother; Diabetes in her father; Heart attack in her brother; Heart disease in her father and mother; Hypertension in her father; Stomach cancer in her  mother; Uterine cancer in her mother.  ROS:   Please see the history of present illness.     All other systems reviewed and are negative.  EKGs/Labs/Other Studies Reviewed:    The following studies were reviewed today:   EKG:     Recent Labs: 07/26/2021: BUN 15; Creatinine, Ser 0.78; Potassium 4.4; Sodium 137  Recent Lipid Panel No results found for: "CHOL", "TRIG", "HDL", "CHOLHDL", "VLDL", "LDLCALC", "LDLDIRECT"   Risk Assessment/Calculations:           Physical Exam:    Physical Exam: Blood pressure (!) 145/62, pulse 95, height 5' 1.5" (1.562 m), weight 181 lb 6.4 oz (82.3 kg), SpO2 96 %.  HYPERTENSION CONTROL Vitals:   10/02/21 0947 10/02/21 1013  BP: (!) 148/74 (!) 145/62    The patient's blood pressure is elevated above target today.  In order to address the patient's elevated BP: A current anti-hypertensive medication was adjusted today.       GEN:  Well nourished, well developed in no acute distress HEENT: Normal NECK: No JVD; No carotid bruits LYMPHATICS: No lymphadenopathy CARDIAC: RRR  soft systolic murmur  RESPIRATORY:  Clear to auscultation without rales, wheezing or rhonchi  ABDOMEN: Soft, non-tender, non-distended MUSCULOSKELETAL:  No edema; No deformity  SKIN: Warm and dry NEUROLOGIC:  Alert and oriented x 3   ASSESSMENT:    No diagnosis found. PLAN:      Murmur : She has mild aortic insufficiency.  She is not having any symptoms.  She is exercising regularly at the [facility].   2.  Chronic diastolic CHF: She is doing well.  She is been losing weight.  She is feeling quite a bit better.  3.  Hypertension: Blood pressure is mildly elevated.  We will increase the Bystolic to 5 mg a day.  She will continue with her weight loss efforts.           Medication Adjustments/Labs and Tests Ordered: Current medicines are reviewed at length with the patient today.  Concerns regarding medicines are outlined above.  No orders  of the defined types were placed in this encounter.  Meds ordered this encounter  Medications   nebivolol (BYSTOLIC) 5 MG tablet    Sig: Take 1 tablet (5 mg total) by mouth daily.    Dispense:  90 tablet    Refill:  3    Patient Instructions  Medication Instructions:  INCREASE NEBIVOLOL TO 5 MG EVERY DAY  *If you  need a refill on your cardiac medications before your next appointment, please call your pharmacy*   Lab Work: NONE If you have labs (blood work) drawn today and your tests are completely normal, you will receive your results only by: [place] (if you have MyChart) OR A paper copy in the mail If you have any lab test that is abnormal or we need to change your treatment, we will call you to review the results.   Testing/Procedures: NONE   Follow-Up: At [facility], you and your health needs are our priority.  As part of our continuing mission to provide you with exceptional heart care, we have created designated Provider Care Teams.  These Care Teams include your primary Cardiologist (physician) and Advanced Practice Providers (APPs -  Physician Assistants and Nurse Practitioners) who all work together to provide you with the care you need, when you need it.  We recommend signing up for the patient portal called "MyChart".  Sign up information is provided on this After Visit Summary.  MyChart is used to connect with patients for Virtual Visits (Telemedicine).  Patients are able to view lab/test results, encounter notes, upcoming appointments, etc.  Non-urgent messages can be sent to your provider as well.   To learn more about what you can do with MyChart, go to [email or web address].    Your next appointment:   6 month(s)  The format for your next appointment:   In Person  Provider:   Mertie Moores, MD      Other Instructions NONE  Important Information About Sugar         Signed, Mertie Moores, MD  10/02/2021 10:22 AM    [place]

## 2021-10-02 ENCOUNTER — Encounter: Payer: Self-pay | Admitting: Cardiovascular Disease

## 2021-10-02 ENCOUNTER — Ambulatory Visit: Payer: Medicare PPO | Attending: Cardiovascular Disease | Admitting: Cardiovascular Disease

## 2021-10-02 DIAGNOSIS — E782 Mixed hyperlipidemia: Secondary | ICD-10-CM

## 2021-10-02 DIAGNOSIS — E785 Hyperlipidemia, unspecified: Secondary | ICD-10-CM | POA: Insufficient documentation

## 2021-10-02 DIAGNOSIS — I10 Essential (primary) hypertension: Secondary | ICD-10-CM

## 2021-10-02 DIAGNOSIS — I351 Nonrheumatic aortic (valve) insufficiency: Secondary | ICD-10-CM

## 2021-10-02 MED ORDER — NEBIVOLOL HCL 5 MG PO TABS: 5.0000 mg | ORAL_TABLET | Freq: Every day | ORAL | 3 refills | Status: DC

## 2021-10-02 NOTE — Patient Instructions (Addendum)
Medication Instructions:  INCREASE NEBIVOLOL TO 5 MG EVERY DAY  *If you need a refill on your cardiac medications before your next appointment, please call your pharmacy*   Lab Work: NONE If you have labs (blood work) drawn today and your tests are completely normal, you will receive your results only by: [place] (if you have MyChart) OR A paper copy in the mail If you have any lab test that is abnormal or we need to change your treatment, we will call you to review the results.   Testing/Procedures: NONE   Follow-Up: At [facility], you and your health needs are our priority.  As part of our continuing mission to provide you with exceptional heart care, we have created designated Provider Care Teams.  These Care Teams include your primary Cardiologist (physician) and Advanced Practice Providers (APPs -  Physician Assistants and Nurse Practitioners) who all work together to provide you with the care you need, when you need it.  We recommend signing up for the patient portal called "MyChart".  Sign up information is provided on this After Visit Summary.  MyChart is used to connect with patients for Virtual Visits (Telemedicine).  Patients are able to view lab/test results, encounter notes, upcoming appointments, etc.  Non-urgent messages can be sent to your provider as well.   To learn more about what you can do with MyChart, go to [email or web address].    Your next appointment:   6 month(s)  The format for your next appointment:   In Person  Provider:   Mertie Moores, MD      Other Instructions NONE  Important Information About Sugar

## 2022-03-31 ENCOUNTER — Encounter: Payer: Self-pay | Admitting: Cardiovascular Disease

## 2022-03-31 NOTE — Progress Notes (Unsigned)
Cardiology Office Note:    Date:  04/01/2022   ID:  Allison Michael, DOB November 06, 1945, MRN [id number]  PCP:  Leighton Ruff, MD (Inactive)   [facility] Providers Cardiologist:  Harsha Yusko    Referring MD: No ref. provider found   Chief Complaint  Patient presents with   aortic insufficiency   Hypertension         History of Present Illness:    Allison Michael is a 77 y.o. female with a hx of murmur , HTN, HLD Has not been taking her BP regularly .  Watches her salt  Mostly watches her salt   Tries to exercise, Stays active Has been having some issues in her rotator cuff - does not sleep well due to the pain   Sept. 19, 2023 Allison Michael is seen today for follow up of her hear murmur, HTN, HLD  Echo :  normal LV systolic function, grade I DD, mild AI  She is now going to the [facility] and swims for an hour a day 3 days a week . Is eating better   Wt is 181 lbs ( down 7 lbs )   Dr. Buddy Duty manages her lipids .    April 01, 2022 Allison Michael is seen today for follow up of her heart murmur , HTN, HLD  Mild AI Wt is 176  Exercising , watching her diet Works out at the [employer] .  Stays busy      Past Medical History:  Diagnosis Date   Asthma    Chronic vulvitis    DM (diabetes mellitus) (HCC)    Estrogen deficiency    Gait instability    Heart murmur    History of MRSA infection    HTN (hypertension)    Hypercholesteremia    Hyperlipidemia    Hypothyroidism    Lichen sclerosus    Osteoarthritis    Personal history of Methicillin resistant Staphylococcus aureus infection     Past Surgical History:  Procedure Laterality Date   CATARACT EXTRACTION Bilateral    CHOLECYTECTOMY     PARTIAL HYSTERECTOMY      Current Medications: Current Meds  Medication Sig   ACCU-CHEK GUIDE test strip daily.   acetaminophen (TYLENOL) 500 MG tablet Take 500 mg by mouth every 6 (six) hours as needed.   amLODipine (NORVASC) 10 MG tablet Take 1 tablet (10 mg total) by mouth daily.    atorvastatin (LIPITOR) 40 MG tablet Take 1 tablet (40 mg total) by mouth daily.   Dulaglutide (TRULICITY) 3 0000000 SOPN Inject into the skin.   empagliflozin (JARDIANCE) 25 MG TABS tablet Take by mouth daily.   Fluticasone Furoate 50 MCG/ACT AEPB Inhale into the lungs.   hydrochlorothiazide (HYDRODIURIL) 25 MG tablet Take 1 tablet (25 mg total) by mouth daily.   levothyroxine (SYNTHROID) 75 MCG tablet Take 75 mcg by mouth daily before breakfast.   losartan (COZAAR) 100 MG tablet Take 1 tablet (100 mg total) by mouth daily.   miconazole (MICOTIN) 2 % cream Apply 1 application  topically 2 (two) times daily.   Multiple Vitamins-Minerals (CENTRUM SILVER PO) Take by mouth.   naproxen sodium (ALEVE) 220 MG tablet Take 220 mg by mouth.   nebivolol (BYSTOLIC) 5 MG tablet Take 1 tablet (5 mg total) by mouth daily.   nystatin-triamcinolone (MYCOLOG II) cream Apply 1 application  topically 2 (two) times daily.   Potassium Chloride ER 20 MEQ TBCR Take 1 tablet by mouth daily.   [DISCONTINUED] aspirin EC 81 MG tablet Take 81  mg by mouth daily. Swallow whole.     Allergies:   Patient has no allergy information on record.   Social History   Socioeconomic History   Marital status: Married    Spouse name: Not on file   Number of children: Not on file   Years of education: Not on file   Highest education level: Not on file  Occupational History   Not on file  Tobacco Use   Smoking status: Former    Types: Cigarettes   Smokeless tobacco: Never  Substance and Sexual Activity   Alcohol use: Not on file   Drug use: Not on file   Sexual activity: Not on file  Other Topics Concern   Not on file  Social History Narrative   Not on file   Social Determinants of Health   Financial Resource Strain: Not on file  Food Insecurity: Not on file  Transportation Needs: Not on file  Physical Activity: Not on file  Stress: Not on file  Social Connections: Not on file     Family History: The  patient's family history includes CAD in her brother; Diabetes in her father; Heart attack in her brother; Heart disease in her father and mother; Hypertension in her father; Stomach cancer in her mother; Uterine cancer in her mother.  ROS:   Please see the history of present illness.     All other systems reviewed and are negative.  EKGs/Labs/Other Studies Reviewed:    The following studies were reviewed today:   EKG:     Recent Labs: 07/26/2021: BUN 15; Creatinine, Ser 0.78; Potassium 4.4; Sodium 137  Recent Lipid Panel No results found for: "CHOL", "TRIG", "HDL", "CHOLHDL", "VLDL", "LDLCALC", "LDLDIRECT"   Risk Assessment/Calculations:           Physical Exam:    Physical Exam: Blood pressure 126/60, pulse 85, height 5' 1.5" (1.562 m), weight 176 lb 3.2 oz (79.9 kg), SpO2 96 %.       GEN:  Well nourished, well developed in no acute distress HEENT: Normal NECK: No JVD; No carotid bruits LYMPHATICS: No lymphadenopathy CARDIAC: RRR , no murmurs, rubs, gallops RESPIRATORY:  Clear to auscultation without rales, wheezing or rhonchi  ABDOMEN: Soft, non-tender, non-distended MUSCULOSKELETAL:  No edema; No deformity  SKIN: Warm and dry NEUROLOGIC:  Alert and oriented x 3    ASSESSMENT:    No diagnosis found. PLAN:      Murmur :  has soft systolic murmur from her aortic sclerosis  and mild TR    2.  Chronic diastolic CHF:    3.  Hypertension:  BP is well controlled.      Medication Adjustments/Labs and Tests Ordered: Current medicines are reviewed at length with the patient today.  Concerns regarding medicines are outlined above.  No orders of the defined types were placed in this encounter.  No orders of the defined types were placed in this encounter.   Patient Instructions  Medication Instructions:  Your physician has recommended you make the following change in your medication: 1) STOP taking Aspirin  *If you need a refill on your cardiac medications  before your next appointment, please call your pharmacy*  Follow-Up: At [facility], you and your health needs are our priority.  As part of our continuing mission to provide you with exceptional heart care, we have created designated Provider Care Teams.  These Care Teams include your primary Cardiologist (physician) and Advanced Practice Providers (APPs -  Physician Assistants and Nurse  Practitioners) who all work together to provide you with the care you need, when you need it.  Your next appointment:   1 year(s)  Provider:   Mertie Moores, MD       Signed, Mertie Moores, MD  04/01/2022 2:33 PM    [place]

## 2022-04-01 ENCOUNTER — Ambulatory Visit: Payer: Medicare PPO | Attending: Cardiovascular Disease | Admitting: Cardiovascular Disease

## 2022-04-01 ENCOUNTER — Encounter: Payer: Self-pay | Admitting: Cardiovascular Disease

## 2022-04-01 VITALS — BP 126/60 | HR 85 | Ht 61.5 in | Wt 176.2 lb

## 2022-04-01 DIAGNOSIS — I10 Essential (primary) hypertension: Secondary | ICD-10-CM | POA: Diagnosis not present

## 2022-04-01 DIAGNOSIS — I351 Nonrheumatic aortic (valve) insufficiency: Secondary | ICD-10-CM

## 2022-04-01 NOTE — Patient Instructions (Signed)
Medication Instructions:  Your physician has recommended you make the following change in your medication: 1) STOP taking Aspirin  *If you need a refill on your cardiac medications before your next appointment, please call your pharmacy*  Follow-Up: At [facility], you and your health needs are our priority.  As part of our continuing mission to provide you with exceptional heart care, we have created designated Provider Care Teams.  These Care Teams include your primary Cardiologist (physician) and Advanced Practice Providers (APPs -  Physician Assistants and Nurse Practitioners) who all work together to provide you with the care you need, when you need it.  Your next appointment:   1 year(s)  Provider:   Mertie Moores, MD

## 2022-05-01 DIAGNOSIS — E039 Hypothyroidism, unspecified: Secondary | ICD-10-CM | POA: Diagnosis not present

## 2022-05-01 DIAGNOSIS — E782 Mixed hyperlipidemia: Secondary | ICD-10-CM | POA: Diagnosis not present

## 2022-05-01 DIAGNOSIS — E119 Type 2 diabetes mellitus without complications: Secondary | ICD-10-CM | POA: Diagnosis not present

## 2022-05-01 DIAGNOSIS — I10 Essential (primary) hypertension: Secondary | ICD-10-CM | POA: Diagnosis not present

## 2022-06-10 ENCOUNTER — Other Ambulatory Visit: Payer: Self-pay | Admitting: Cardiovascular Disease

## 2022-06-10 DIAGNOSIS — I350 Nonrheumatic aortic (valve) stenosis: Secondary | ICD-10-CM

## 2022-06-10 DIAGNOSIS — I340 Nonrheumatic mitral (valve) insufficiency: Secondary | ICD-10-CM

## 2022-06-26 DIAGNOSIS — I110 Hypertensive heart disease with heart failure: Secondary | ICD-10-CM | POA: Diagnosis not present

## 2022-06-26 DIAGNOSIS — Z0000 Encounter for general adult medical examination without abnormal findings: Secondary | ICD-10-CM | POA: Diagnosis not present

## 2022-06-26 DIAGNOSIS — E1165 Type 2 diabetes mellitus with hyperglycemia: Secondary | ICD-10-CM | POA: Diagnosis not present

## 2022-06-26 DIAGNOSIS — N763 Subacute and chronic vulvitis: Secondary | ICD-10-CM | POA: Diagnosis not present

## 2022-06-26 DIAGNOSIS — Z79899 Other long term (current) drug therapy: Secondary | ICD-10-CM | POA: Diagnosis not present

## 2022-06-26 DIAGNOSIS — E039 Hypothyroidism, unspecified: Secondary | ICD-10-CM | POA: Diagnosis not present

## 2022-06-26 DIAGNOSIS — I350 Nonrheumatic aortic (valve) stenosis: Secondary | ICD-10-CM | POA: Diagnosis not present

## 2022-06-26 DIAGNOSIS — Z23 Encounter for immunization: Secondary | ICD-10-CM | POA: Diagnosis not present

## 2022-06-26 DIAGNOSIS — I5032 Chronic diastolic (congestive) heart failure: Secondary | ICD-10-CM | POA: Diagnosis not present

## 2022-07-17 ENCOUNTER — Other Ambulatory Visit: Payer: Self-pay | Admitting: Cardiovascular Disease

## 2022-08-01 ENCOUNTER — Other Ambulatory Visit (HOSPITAL_COMMUNITY): Payer: Self-pay

## 2022-08-01 ENCOUNTER — Other Ambulatory Visit: Payer: Self-pay

## 2022-08-01 MED ORDER — NYSTATIN-TRIAMCINOLONE 100000-0.1 UNIT/GM-% EX CREA
TOPICAL_CREAM | CUTANEOUS | 1 refills | Status: DC
  Filled 2022-08-01: qty 45, 30d supply, fill #0
  Filled 2022-09-10: qty 15, 10d supply, fill #0
  Filled 2022-09-10: qty 30, 20d supply, fill #0
  Filled 2022-12-06 (×2): qty 45, 30d supply, fill #1

## 2022-08-02 ENCOUNTER — Other Ambulatory Visit (HOSPITAL_COMMUNITY): Payer: Self-pay

## 2022-08-02 MED ORDER — TRULICITY 4.5 MG/0.5ML ~~LOC~~ SOAJ
4.5000 mg | SUBCUTANEOUS | 8 refills | Status: DC
  Filled 2022-08-05 (×2): qty 2, 28d supply, fill #0

## 2022-08-02 MED ORDER — FLUTICASONE PROPIONATE 50 MCG/ACT NA SUSP
2.0000 | Freq: Every day | NASAL | 0 refills | Status: DC
  Filled 2022-09-10: qty 16, 30d supply, fill #0
  Filled 2023-03-27: qty 16, 30d supply, fill #1

## 2022-08-02 MED ORDER — NYSTATIN-TRIAMCINOLONE 100000-0.1 UNIT/GM-% EX CREA: TOPICAL_CREAM | CUTANEOUS | 1 refills | Status: AC

## 2022-08-02 MED ORDER — ACCU-CHEK GUIDE VI STRP: ORAL_STRIP | 3 refills | Status: AC

## 2022-08-05 ENCOUNTER — Other Ambulatory Visit (HOSPITAL_BASED_OUTPATIENT_CLINIC_OR_DEPARTMENT_OTHER): Payer: Self-pay

## 2022-08-05 DIAGNOSIS — E039 Hypothyroidism, unspecified: Secondary | ICD-10-CM | POA: Diagnosis not present

## 2022-08-05 DIAGNOSIS — E782 Mixed hyperlipidemia: Secondary | ICD-10-CM | POA: Diagnosis not present

## 2022-08-05 DIAGNOSIS — I10 Essential (primary) hypertension: Secondary | ICD-10-CM | POA: Diagnosis not present

## 2022-08-05 DIAGNOSIS — E119 Type 2 diabetes mellitus without complications: Secondary | ICD-10-CM | POA: Diagnosis not present

## 2022-08-20 ENCOUNTER — Other Ambulatory Visit (HOSPITAL_COMMUNITY): Payer: Self-pay

## 2022-09-02 ENCOUNTER — Other Ambulatory Visit (HOSPITAL_COMMUNITY): Payer: Self-pay

## 2022-09-02 MED ORDER — MOUNJARO 5 MG/0.5ML ~~LOC~~ SOAJ
0.5000 mg | SUBCUTANEOUS | 0 refills | Status: DC
  Filled 2022-09-02: qty 2, 28d supply, fill #0

## 2022-09-03 ENCOUNTER — Other Ambulatory Visit (HOSPITAL_COMMUNITY): Payer: Self-pay

## 2022-09-09 ENCOUNTER — Other Ambulatory Visit (HOSPITAL_COMMUNITY): Payer: Self-pay

## 2022-09-09 DIAGNOSIS — L988 Other specified disorders of the skin and subcutaneous tissue: Secondary | ICD-10-CM | POA: Diagnosis not present

## 2022-09-10 ENCOUNTER — Other Ambulatory Visit (HOSPITAL_BASED_OUTPATIENT_CLINIC_OR_DEPARTMENT_OTHER): Payer: Self-pay

## 2022-09-10 ENCOUNTER — Other Ambulatory Visit (HOSPITAL_COMMUNITY): Payer: Self-pay

## 2022-09-10 ENCOUNTER — Other Ambulatory Visit: Payer: Self-pay

## 2022-09-10 MED ORDER — CLOTRIMAZOLE 1 % EX CREA
TOPICAL_CREAM | CUTANEOUS | 0 refills | Status: DC
  Filled 2022-09-10: qty 56, 14d supply, fill #0

## 2022-09-10 MED ORDER — JARDIANCE 25 MG PO TABS
25.0000 mg | ORAL_TABLET | Freq: Every day | ORAL | 4 refills | Status: DC
  Filled 2022-09-10: qty 90, 90d supply, fill #0
  Filled 2022-12-06 (×2): qty 90, 90d supply, fill #1
  Filled 2023-02-27: qty 90, 90d supply, fill #2
  Filled 2023-05-20: qty 90, 90d supply, fill #3
  Filled 2023-08-29: qty 90, 90d supply, fill #4

## 2022-09-10 MED FILL — Hydrochlorothiazide Tab 25 MG: ORAL | 90 days supply | Qty: 90 | Fill #0 | Status: AC

## 2022-09-11 ENCOUNTER — Other Ambulatory Visit (HOSPITAL_COMMUNITY): Payer: Self-pay

## 2022-09-18 DIAGNOSIS — E119 Type 2 diabetes mellitus without complications: Secondary | ICD-10-CM | POA: Diagnosis not present

## 2022-09-18 DIAGNOSIS — H52203 Unspecified astigmatism, bilateral: Secondary | ICD-10-CM | POA: Diagnosis not present

## 2022-09-18 DIAGNOSIS — H04121 Dry eye syndrome of right lacrimal gland: Secondary | ICD-10-CM | POA: Diagnosis not present

## 2022-09-18 DIAGNOSIS — Z961 Presence of intraocular lens: Secondary | ICD-10-CM | POA: Diagnosis not present

## 2022-09-18 DIAGNOSIS — Z1231 Encounter for screening mammogram for malignant neoplasm of breast: Secondary | ICD-10-CM | POA: Diagnosis not present

## 2022-10-03 ENCOUNTER — Other Ambulatory Visit: Payer: Self-pay | Admitting: Cardiovascular Disease

## 2022-10-03 ENCOUNTER — Other Ambulatory Visit (HOSPITAL_COMMUNITY): Payer: Self-pay

## 2022-10-03 MED ORDER — NEBIVOLOL HCL 5 MG PO TABS
5.0000 mg | ORAL_TABLET | Freq: Every day | ORAL | 1 refills | Status: DC
  Filled 2022-10-03: qty 90, 90d supply, fill #0
  Filled 2022-12-31 (×2): qty 90, 90d supply, fill #1

## 2022-10-03 MED ORDER — LOSARTAN POTASSIUM 100 MG PO TABS
100.0000 mg | ORAL_TABLET | Freq: Every day | ORAL | 1 refills | Status: DC
  Filled 2022-10-03: qty 90, 90d supply, fill #0
  Filled 2022-12-31 (×2): qty 90, 90d supply, fill #1

## 2022-10-03 MED ORDER — POTASSIUM CHLORIDE ER 20 MEQ PO TBCR
1.0000 | EXTENDED_RELEASE_TABLET | Freq: Every day | ORAL | 1 refills | Status: DC
  Filled 2022-10-03: qty 90, 90d supply, fill #0
  Filled 2022-12-31 (×2): qty 90, 90d supply, fill #1

## 2022-10-03 MED ORDER — LEVOTHYROXINE SODIUM 75 MCG PO TABS
75.0000 ug | ORAL_TABLET | Freq: Every morning | ORAL | 3 refills | Status: DC
  Filled 2022-10-03: qty 90, 90d supply, fill #0
  Filled 2022-12-31 (×2): qty 90, 90d supply, fill #1
  Filled 2023-05-06: qty 90, 90d supply, fill #2
  Filled 2023-08-02: qty 90, 90d supply, fill #3

## 2022-10-03 MED ORDER — ATORVASTATIN CALCIUM 40 MG PO TABS
40.0000 mg | ORAL_TABLET | Freq: Every day | ORAL | 1 refills | Status: DC
  Filled 2022-10-03: qty 90, 90d supply, fill #0
  Filled 2023-02-27: qty 90, 90d supply, fill #1

## 2022-10-04 ENCOUNTER — Other Ambulatory Visit (HOSPITAL_COMMUNITY): Payer: Self-pay

## 2022-10-04 MED ORDER — COVID-19 MRNA VAC-TRIS(PFIZER) 30 MCG/0.3ML IM SUSY
0.3000 mL | PREFILLED_SYRINGE | INTRAMUSCULAR | 0 refills | Status: AC
  Filled 2022-10-04: qty 0.3, 1d supply, fill #0

## 2022-10-08 ENCOUNTER — Other Ambulatory Visit (HOSPITAL_COMMUNITY): Payer: Self-pay

## 2022-10-08 DIAGNOSIS — Z23 Encounter for immunization: Secondary | ICD-10-CM | POA: Diagnosis not present

## 2022-10-08 MED ORDER — MOUNJARO 5 MG/0.5ML ~~LOC~~ SOAJ
5.0000 mg | SUBCUTANEOUS | 0 refills | Status: DC
  Filled 2022-10-08: qty 2, 28d supply, fill #0
  Filled 2022-11-05: qty 2, 28d supply, fill #1
  Filled 2022-12-06 (×2): qty 2, 28d supply, fill #2

## 2022-10-23 ENCOUNTER — Other Ambulatory Visit: Payer: Self-pay

## 2022-10-23 MED ORDER — ATORVASTATIN CALCIUM 40 MG PO TABS: 40.0000 mg | ORAL_TABLET | Freq: Every day | ORAL | 1 refills | Status: DC

## 2022-10-24 ENCOUNTER — Other Ambulatory Visit (HOSPITAL_COMMUNITY): Payer: Self-pay

## 2022-10-24 ENCOUNTER — Other Ambulatory Visit: Payer: Self-pay | Admitting: Cardiovascular Disease

## 2022-11-05 ENCOUNTER — Other Ambulatory Visit (HOSPITAL_COMMUNITY): Payer: Self-pay

## 2022-12-03 ENCOUNTER — Other Ambulatory Visit (HOSPITAL_COMMUNITY): Payer: Self-pay

## 2022-12-06 ENCOUNTER — Other Ambulatory Visit (HOSPITAL_COMMUNITY): Payer: Self-pay

## 2022-12-06 MED FILL — Hydrochlorothiazide Tab 25 MG: ORAL | 90 days supply | Qty: 90 | Fill #1 | Status: AC

## 2022-12-06 MED FILL — Hydrochlorothiazide Tab 25 MG: ORAL | 90 days supply | Qty: 90 | Fill #1 | Status: CN

## 2022-12-06 MED FILL — Amlodipine Besylate Tab 10 MG (Base Equivalent): ORAL | 90 days supply | Qty: 90 | Fill #0 | Status: AC

## 2022-12-06 MED FILL — Amlodipine Besylate Tab 10 MG (Base Equivalent): ORAL | 90 days supply | Qty: 90 | Fill #0 | Status: CN

## 2022-12-31 ENCOUNTER — Other Ambulatory Visit: Payer: Self-pay

## 2022-12-31 ENCOUNTER — Other Ambulatory Visit (HOSPITAL_COMMUNITY): Payer: Self-pay

## 2022-12-31 MED ORDER — MOUNJARO 5 MG/0.5ML ~~LOC~~ SOAJ
5.0000 mg | SUBCUTANEOUS | 0 refills | Status: DC
  Filled 2022-12-31: qty 6, 84d supply, fill #0

## 2023-01-01 ENCOUNTER — Other Ambulatory Visit (HOSPITAL_COMMUNITY): Payer: Self-pay

## 2023-01-07 ENCOUNTER — Other Ambulatory Visit (HOSPITAL_COMMUNITY): Payer: Self-pay

## 2023-02-04 DIAGNOSIS — I10 Essential (primary) hypertension: Secondary | ICD-10-CM | POA: Diagnosis not present

## 2023-02-04 DIAGNOSIS — E039 Hypothyroidism, unspecified: Secondary | ICD-10-CM | POA: Diagnosis not present

## 2023-02-04 DIAGNOSIS — E782 Mixed hyperlipidemia: Secondary | ICD-10-CM | POA: Diagnosis not present

## 2023-02-04 DIAGNOSIS — E119 Type 2 diabetes mellitus without complications: Secondary | ICD-10-CM | POA: Diagnosis not present

## 2023-02-18 ENCOUNTER — Other Ambulatory Visit (HOSPITAL_COMMUNITY): Payer: Self-pay

## 2023-02-19 ENCOUNTER — Other Ambulatory Visit: Payer: Self-pay

## 2023-02-19 ENCOUNTER — Other Ambulatory Visit (HOSPITAL_COMMUNITY): Payer: Self-pay

## 2023-02-20 ENCOUNTER — Other Ambulatory Visit: Payer: Self-pay

## 2023-02-21 ENCOUNTER — Other Ambulatory Visit (HOSPITAL_COMMUNITY): Payer: Self-pay

## 2023-02-21 ENCOUNTER — Other Ambulatory Visit: Payer: Self-pay

## 2023-02-24 ENCOUNTER — Other Ambulatory Visit: Payer: Self-pay

## 2023-02-24 ENCOUNTER — Other Ambulatory Visit (HOSPITAL_COMMUNITY): Payer: Self-pay

## 2023-02-24 MED ORDER — NYSTATIN-TRIAMCINOLONE 100000-0.1 UNIT/GM-% EX CREA
1.0000 | TOPICAL_CREAM | Freq: Two times a day (BID) | CUTANEOUS | 1 refills | Status: AC | PRN
  Filled 2023-02-24: qty 60, 30d supply, fill #0
  Filled 2023-02-27: qty 30, 15d supply, fill #0
  Filled 2023-02-27: qty 15, 8d supply, fill #0

## 2023-02-26 ENCOUNTER — Other Ambulatory Visit: Payer: Self-pay

## 2023-02-27 ENCOUNTER — Other Ambulatory Visit: Payer: Self-pay

## 2023-02-27 ENCOUNTER — Other Ambulatory Visit (HOSPITAL_COMMUNITY): Payer: Self-pay

## 2023-02-27 MED FILL — Amlodipine Besylate Tab 10 MG (Base Equivalent): ORAL | 90 days supply | Qty: 90 | Fill #1 | Status: AC

## 2023-02-27 MED FILL — Hydrochlorothiazide Tab 25 MG: ORAL | 90 days supply | Qty: 90 | Fill #2 | Status: AC

## 2023-02-28 ENCOUNTER — Other Ambulatory Visit: Payer: Self-pay

## 2023-03-27 ENCOUNTER — Other Ambulatory Visit (HOSPITAL_COMMUNITY): Payer: Self-pay

## 2023-03-27 ENCOUNTER — Other Ambulatory Visit: Payer: Self-pay | Admitting: Cardiovascular Disease

## 2023-03-27 ENCOUNTER — Other Ambulatory Visit: Payer: Self-pay

## 2023-03-27 MED ORDER — LOSARTAN POTASSIUM 100 MG PO TABS
100.0000 mg | ORAL_TABLET | Freq: Every day | ORAL | 1 refills | Status: AC
  Filled 2023-03-27: qty 90, 90d supply, fill #0
  Filled 2023-07-19: qty 90, 90d supply, fill #1

## 2023-03-27 MED ORDER — POTASSIUM CHLORIDE ER 20 MEQ PO TBCR
1.0000 | EXTENDED_RELEASE_TABLET | Freq: Every day | ORAL | 1 refills | Status: DC
  Filled 2023-03-27: qty 90, 90d supply, fill #0
  Filled 2023-06-23: qty 90, 90d supply, fill #1

## 2023-03-27 MED ORDER — NEBIVOLOL HCL 5 MG PO TABS
5.0000 mg | ORAL_TABLET | Freq: Every day | ORAL | 1 refills | Status: DC
  Filled 2023-03-27: qty 90, 90d supply, fill #0
  Filled 2023-06-23: qty 90, 90d supply, fill #1

## 2023-03-29 ENCOUNTER — Other Ambulatory Visit (HOSPITAL_COMMUNITY): Payer: Self-pay

## 2023-04-04 ENCOUNTER — Other Ambulatory Visit (HOSPITAL_COMMUNITY): Payer: Self-pay

## 2023-04-04 MED ORDER — MOUNJARO 5 MG/0.5ML ~~LOC~~ SOAJ
5.0000 mg | SUBCUTANEOUS | 0 refills | Status: DC
  Filled 2023-04-04: qty 2, 28d supply, fill #0

## 2023-04-04 MED ORDER — MOUNJARO 5 MG/0.5ML ~~LOC~~ SOAJ
5.0000 mg | SUBCUTANEOUS | 3 refills | Status: AC
  Filled 2023-04-04 – 2023-04-05 (×2): qty 6, 84d supply, fill #0
  Filled 2023-06-23: qty 6, 84d supply, fill #1

## 2023-04-04 MED ORDER — FLUTICASONE PROPIONATE 50 MCG/ACT NA SUSP
2.0000 | Freq: Every day | NASAL | 3 refills | Status: AC
  Filled 2023-04-04 – 2023-07-19 (×2): qty 48, 90d supply, fill #0
  Filled 2023-10-14: qty 48, 90d supply, fill #1
  Filled 2024-01-12: qty 48, 90d supply, fill #2

## 2023-04-05 ENCOUNTER — Other Ambulatory Visit (HOSPITAL_COMMUNITY): Payer: Self-pay

## 2023-04-07 ENCOUNTER — Other Ambulatory Visit (HOSPITAL_COMMUNITY): Payer: Self-pay

## 2023-04-07 ENCOUNTER — Encounter: Payer: Self-pay | Admitting: Cardiovascular Disease

## 2023-04-07 NOTE — Progress Notes (Unsigned)
 Cardiology Office Note:    Date:  04/08/2023   ID:  Allison Michael, DOB 29-Jan-1945, MRN [id number]  PCP:  Allison Hale, MD   [facility] Providers Cardiologist:  Shahara Hartsfield    Referring MD: No ref. provider found   Chief Complaint  Patient presents with   Hypertension         History of Present Illness:    Allison Michael is a 78 y.o. female with a hx of murmur , HTN, HLD Has not been taking her BP regularly .  Watches her salt  Mostly watches her salt   Tries to exercise, Stays active Has been having some issues in her rotator cuff - does not sleep well due to the pain   Sept. 19, 2023 Allison Michael is seen today for follow up of her hear murmur, HTN, HLD  Echo :  normal LV systolic function, grade I DD, mild AI  She is now going to the [facility] and swims for an hour a day 3 days a week . Is eating better   Wt is 181 lbs ( down 7 lbs )   Dr. Sharl Ma manages her lipids .    April 01, 2022 Allison Michael is seen today for follow up of her heart murmur , HTN, HLD  Mild AI Wt is 176  Exercising , watching her diet Works out at the [employer] .  Stays busy    April 08, 2023 Allison Michael is seen for follow up of her murmur, HTN,HLD, mild AI   Doing well  Is moving , walking quite a bit   Will start back at the pool this summer  Her last LDL is 39  Has mild  AS/ AI on echo  Causes a systolic and diastolic murmur    Past Medical History:  Diagnosis Date   Asthma    Chronic vulvitis    DM (diabetes mellitus) (HCC)    Estrogen deficiency    Gait instability    Heart murmur    History of MRSA infection    HTN (hypertension)    Hypercholesteremia    Hyperlipidemia    Hypothyroidism    Lichen sclerosus    Osteoarthritis    Personal history of Methicillin resistant Staphylococcus aureus infection     Past Surgical History:  Procedure Laterality Date   CATARACT EXTRACTION Bilateral    CHOLECYTECTOMY     PARTIAL HYSTERECTOMY      Current Medications: Current Meds   Medication Sig   amLODipine (NORVASC) 10 MG tablet Take 1 tablet (10 mg total) by mouth daily.   atorvastatin (LIPITOR) 40 MG tablet Take 1 tablet (40 mg total) by mouth daily.   empagliflozin (JARDIANCE) 25 MG TABS tablet Take 1 tablet (25 mg total) by mouth daily.   fluticasone (FLONASE) 50 MCG/ACT nasal spray Place 2 sprays into both nostrils daily.   glucose blood (ACCU-CHEK GUIDE) test strip Use to check blood sugar once daily   hydrochlorothiazide (HYDRODIURIL) 25 MG tablet Take 1 tablet (25 mg total) by mouth daily.   levothyroxine (SYNTHROID) 75 MCG tablet Take 1 tablet (75 mcg total) by mouth every morning on an empty stomach   losartan (COZAAR) 100 MG tablet Take 1 tablet (100 mg total) by mouth daily.   naproxen sodium (ALEVE) 220 MG tablet Take 220 mg by mouth.   nebivolol (BYSTOLIC) 5 MG tablet Take 1 tablet (5 mg total) by mouth daily.   nystatin-triamcinolone (MYCOLOG II) cream Apply 1 Application to affected areas topically 2 (two) times  daily as needed.   Potassium Chloride ER 20 MEQ TBCR Take 1 tablet by mouth daily.   tirzepatide [facility]) 5 MG/0.5ML Pen Inject 5 mg into the skin once a week.   [DISCONTINUED] acetaminophen (TYLENOL) 500 MG tablet Take 500 mg by mouth every 6 (six) hours as needed.   [DISCONTINUED] atorvastatin (LIPITOR) 40 MG tablet Take 1 tablet (40 mg total) by mouth daily.   [DISCONTINUED] Fluticasone Furoate 50 MCG/ACT AEPB Inhale into the lungs.   [DISCONTINUED] miconazole (MICOTIN) 2 % cream Apply 1 application  topically 2 (two) times daily.   [DISCONTINUED] Multiple Vitamins-Minerals (CENTRUM SILVER PO) Take by mouth.     Allergies:   Patient has no allergy information on record.   Social History   Socioeconomic History   Marital status: Married    Spouse name: Not on file   Number of children: Not on file   Years of education: Not on file   Highest education level: Not on file  Occupational History   Not on file  Tobacco Use   Smoking  status: Former    Types: Cigarettes   Smokeless tobacco: Never  Substance and Sexual Activity   Alcohol use: Not on file   Drug use: Not on file   Sexual activity: Not on file  Other Topics Concern   Not on file  Social History Narrative   Not on file   Social Drivers of Health   Financial Resource Strain: Not on file  Food Insecurity: Not on file  Transportation Needs: Not on file  Physical Activity: Not on file  Stress: Not on file  Social Connections: Not on file     Family History: The patient's family history includes CAD in her brother; Diabetes in her father; Heart attack in her brother; Heart disease in her father and mother; Hypertension in her father; Stomach cancer in her mother; Uterine cancer in her mother.  ROS:   Please see the history of present illness.     All other systems reviewed and are negative.  EKGs/Labs/Other Studies Reviewed:    The following studies were reviewed today:   EKG:     EKG Interpretation Date/Time:  Tuesday April 08 2023 11:13:38 EDT Ventricular Rate:  79 PR Interval:  204 QRS Duration:  86 QT Interval:  396 QTC Calculation: 454 R Axis:   -4  Text Interpretation: Normal sinus rhythm Low voltage QRS No previous ECGs available Confirmed by Kristeen Miss ([id number]) on 04/08/2023 11:31:46 AM     Recent Labs: No results found for requested labs within last 365 days.  Recent Lipid Panel No results found for: "CHOL", "TRIG", "HDL", "CHOLHDL", "VLDL", "LDLCALC", "LDLDIRECT"   Risk Assessment/Calculations:           Physical Exam:     Physical Exam: Blood pressure 110/60, pulse 84, height 5' 0.15" (1.528 m), weight 174 lb 9.6 oz (79.2 kg), SpO2 93%.      GEN:  Well nourished, well developed in no acute distress HEENT: Normal NECK: No JVD; No carotid bruits LYMPHATICS: No lymphadenopathy CARDIAC: RRR soft diastolic murmur,  2/6 systolic  murmur  RESPIRATORY:  Clear to auscultation without rales, wheezing or rhonchi   ABDOMEN: Soft, non-tender, non-distended MUSCULOSKELETAL:  No edema; No deformity  SKIN: Warm and dry NEUROLOGIC:  Alert and oriented x 3    ASSESSMENT:    1. Primary hypertension   2. Mixed hyperlipidemia   3. Aortic valve stenosis, etiology of cardiac valve disease unspecified   4. Nonrheumatic aortic  valve insufficiency    PLAN:      Murmur :    she has mild AS, AI .  Valve sounds stable.   No indications for TAVR at this point .  She will likely need a follow up echo in a year    2.  Chronic diastolic CHF:  stable   3.  Hypertension:  BP is well-controlled.  Continue current medications.     Medication Adjustments/Labs and Tests Ordered: Current medicines are reviewed at length with the patient today.  Concerns regarding medicines are outlined above.  Orders Placed This Encounter  Procedures   EKG 12-Lead   No orders of the defined types were placed in this encounter.   Patient Instructions  Follow-Up: At [facility], you and your health needs are our priority.  As part of our continuing mission to provide you with exceptional heart care, we have created designated Provider Care Teams.  These Care Teams include your primary Cardiologist (physician) and Advanced Practice Providers (APPs -  Physician Assistants and Nurse Practitioners) who all work together to provide you with the care you need, when you need it.  Your next appointment:   1 year(s)  Provider:   Kristeen Miss, MD      1st Floor: - Lobby - Registration  - Pharmacy  - Lab - Cafe  2nd Floor: - PV Lab - Diagnostic Testing (echo, CT, nuclear med)  3rd Floor: - Vacant  4th Floor: - TCTS (cardiothoracic surgery) - AFib Clinic - Structural Heart Clinic - Vascular Surgery  - Vascular Ultrasound  5th Floor: - HeartCare Cardiology (general and EP) - Clinical Pharmacy for coumadin, hypertension, lipid, weight-loss medications, and med management appointments    Valet parking  services will be available as well.     Signed, Kristeen Miss, MD  04/08/2023 2:07 PM    [facility]

## 2023-04-08 ENCOUNTER — Ambulatory Visit: Payer: Medicare PPO | Attending: Cardiovascular Disease | Admitting: Cardiovascular Disease

## 2023-04-08 ENCOUNTER — Encounter: Payer: Self-pay | Admitting: Cardiovascular Disease

## 2023-04-08 VITALS — BP 110/60 | HR 84 | Ht 60.15 in | Wt 174.6 lb

## 2023-04-08 DIAGNOSIS — I351 Nonrheumatic aortic (valve) insufficiency: Secondary | ICD-10-CM

## 2023-04-08 DIAGNOSIS — I350 Nonrheumatic aortic (valve) stenosis: Secondary | ICD-10-CM | POA: Insufficient documentation

## 2023-04-08 DIAGNOSIS — E782 Mixed hyperlipidemia: Secondary | ICD-10-CM

## 2023-04-08 DIAGNOSIS — I10 Essential (primary) hypertension: Secondary | ICD-10-CM | POA: Diagnosis not present

## 2023-04-08 NOTE — Patient Instructions (Signed)

## 2023-04-17 ENCOUNTER — Other Ambulatory Visit (HOSPITAL_COMMUNITY): Payer: Self-pay

## 2023-04-19 ENCOUNTER — Other Ambulatory Visit (HOSPITAL_COMMUNITY): Payer: Self-pay

## 2023-05-06 ENCOUNTER — Other Ambulatory Visit: Payer: Self-pay

## 2023-05-20 ENCOUNTER — Other Ambulatory Visit: Payer: Self-pay | Admitting: Cardiovascular Disease

## 2023-05-20 DIAGNOSIS — I340 Nonrheumatic mitral (valve) insufficiency: Secondary | ICD-10-CM

## 2023-05-20 DIAGNOSIS — I350 Nonrheumatic aortic (valve) stenosis: Secondary | ICD-10-CM

## 2023-05-20 MED FILL — Amlodipine Besylate Tab 10 MG (Base Equivalent): ORAL | 90 days supply | Qty: 90 | Fill #2 | Status: AC

## 2023-05-21 ENCOUNTER — Other Ambulatory Visit (HOSPITAL_COMMUNITY): Payer: Self-pay

## 2023-05-21 ENCOUNTER — Other Ambulatory Visit: Payer: Self-pay

## 2023-05-21 MED ORDER — HYDROCHLOROTHIAZIDE 25 MG PO TABS
25.0000 mg | ORAL_TABLET | Freq: Every day | ORAL | 3 refills | Status: AC
  Filled 2023-05-21: qty 90, 90d supply, fill #0
  Filled 2023-08-05: qty 90, 90d supply, fill #1

## 2023-05-21 MED ORDER — ATORVASTATIN CALCIUM 40 MG PO TABS
40.0000 mg | ORAL_TABLET | Freq: Every day | ORAL | 3 refills | Status: AC
  Filled 2023-05-21: qty 90, 90d supply, fill #0

## 2023-07-03 DIAGNOSIS — E119 Type 2 diabetes mellitus without complications: Secondary | ICD-10-CM | POA: Diagnosis not present

## 2023-07-03 DIAGNOSIS — E039 Hypothyroidism, unspecified: Secondary | ICD-10-CM | POA: Diagnosis not present

## 2023-07-03 DIAGNOSIS — E782 Mixed hyperlipidemia: Secondary | ICD-10-CM | POA: Diagnosis not present

## 2023-07-21 ENCOUNTER — Other Ambulatory Visit (HOSPITAL_COMMUNITY): Payer: Self-pay

## 2023-08-04 ENCOUNTER — Other Ambulatory Visit (HOSPITAL_COMMUNITY): Payer: Self-pay

## 2023-08-04 ENCOUNTER — Other Ambulatory Visit (HOSPITAL_BASED_OUTPATIENT_CLINIC_OR_DEPARTMENT_OTHER): Payer: Self-pay

## 2023-08-04 DIAGNOSIS — E782 Mixed hyperlipidemia: Secondary | ICD-10-CM | POA: Diagnosis not present

## 2023-08-04 DIAGNOSIS — E1165 Type 2 diabetes mellitus with hyperglycemia: Secondary | ICD-10-CM | POA: Diagnosis not present

## 2023-08-04 DIAGNOSIS — I10 Essential (primary) hypertension: Secondary | ICD-10-CM | POA: Diagnosis not present

## 2023-08-04 DIAGNOSIS — E039 Hypothyroidism, unspecified: Secondary | ICD-10-CM | POA: Diagnosis not present

## 2023-08-04 MED ORDER — MOUNJARO 7.5 MG/0.5ML ~~LOC~~ SOAJ
7.5000 mg | SUBCUTANEOUS | 4 refills | Status: AC
  Filled 2023-08-04: qty 6, 84d supply, fill #0
  Filled 2023-10-25: qty 6, 84d supply, fill #1

## 2023-08-06 ENCOUNTER — Other Ambulatory Visit (HOSPITAL_COMMUNITY): Payer: Self-pay

## 2023-08-06 MED ORDER — ACCU-CHEK GUIDE W/DEVICE KIT
PACK | 1 refills | Status: AC
  Filled 2023-08-06: qty 1, 30d supply, fill #0

## 2023-08-06 MED ORDER — ACCU-CHEK GUIDE TEST VI STRP
ORAL_STRIP | 4 refills | Status: AC
  Filled 2023-08-06: qty 50, 50d supply, fill #0
  Filled 2023-10-04: qty 50, 50d supply, fill #1
  Filled 2023-12-19: qty 50, 50d supply, fill #2

## 2023-08-06 MED ORDER — ACCU-CHEK SOFTCLIX LANCETS MISC
4 refills | Status: AC
  Filled 2023-08-06: qty 100, 100d supply, fill #0
  Filled 2023-08-06: qty 102, 102d supply, fill #0
  Filled 2023-12-19: qty 100, 100d supply, fill #1

## 2023-08-07 ENCOUNTER — Other Ambulatory Visit (HOSPITAL_COMMUNITY): Payer: Self-pay

## 2023-08-07 DIAGNOSIS — E039 Hypothyroidism, unspecified: Secondary | ICD-10-CM | POA: Diagnosis not present

## 2023-08-07 DIAGNOSIS — Z0000 Encounter for general adult medical examination without abnormal findings: Secondary | ICD-10-CM | POA: Diagnosis not present

## 2023-08-07 DIAGNOSIS — E782 Mixed hyperlipidemia: Secondary | ICD-10-CM | POA: Diagnosis not present

## 2023-08-07 DIAGNOSIS — I350 Nonrheumatic aortic (valve) stenosis: Secondary | ICD-10-CM | POA: Diagnosis not present

## 2023-08-07 DIAGNOSIS — I5032 Chronic diastolic (congestive) heart failure: Secondary | ICD-10-CM | POA: Diagnosis not present

## 2023-08-07 DIAGNOSIS — I10 Essential (primary) hypertension: Secondary | ICD-10-CM | POA: Diagnosis not present

## 2023-08-07 DIAGNOSIS — E1165 Type 2 diabetes mellitus with hyperglycemia: Secondary | ICD-10-CM | POA: Diagnosis not present

## 2023-08-07 MED ORDER — NEBIVOLOL HCL 5 MG PO TABS
5.0000 mg | ORAL_TABLET | Freq: Every day | ORAL | 3 refills | Status: AC
  Filled 2023-08-07 – 2023-10-04 (×2): qty 90, 90d supply, fill #0
  Filled 2023-12-19: qty 90, 90d supply, fill #1

## 2023-08-07 MED ORDER — HYDROCHLOROTHIAZIDE 25 MG PO TABS
25.0000 mg | ORAL_TABLET | Freq: Every morning | ORAL | 3 refills | Status: AC
  Filled 2023-08-07 – 2023-10-25 (×5): qty 90, 90d supply, fill #0
  Filled 2024-02-05: qty 90, 90d supply, fill #1

## 2023-08-07 MED ORDER — AMLODIPINE BESYLATE 10 MG PO TABS
10.0000 mg | ORAL_TABLET | Freq: Every day | ORAL | 3 refills | Status: AC
  Filled 2023-08-07: qty 90, 90d supply, fill #0
  Filled 2023-10-04 – 2023-11-20 (×2): qty 90, 90d supply, fill #1

## 2023-08-07 MED ORDER — POTASSIUM CHLORIDE ER 20 MEQ PO TBCR
20.0000 meq | EXTENDED_RELEASE_TABLET | Freq: Every day | ORAL | 2 refills | Status: AC
  Filled 2023-08-07 – 2023-09-06 (×3): qty 90, 90d supply, fill #0
  Filled 2023-11-27: qty 90, 90d supply, fill #1

## 2023-08-07 MED ORDER — ATORVASTATIN CALCIUM 40 MG PO TABS
40.0000 mg | ORAL_TABLET | Freq: Every day | ORAL | 3 refills | Status: AC
  Filled 2023-08-07: qty 90, 90d supply, fill #0
  Filled 2023-10-04 – 2023-11-22 (×2): qty 90, 90d supply, fill #1

## 2023-08-29 ENCOUNTER — Other Ambulatory Visit: Payer: Self-pay

## 2023-08-29 ENCOUNTER — Other Ambulatory Visit (HOSPITAL_COMMUNITY): Payer: Self-pay

## 2023-09-06 ENCOUNTER — Other Ambulatory Visit (HOSPITAL_COMMUNITY): Payer: Self-pay

## 2023-09-24 DIAGNOSIS — Z1231 Encounter for screening mammogram for malignant neoplasm of breast: Secondary | ICD-10-CM | POA: Diagnosis not present

## 2023-10-04 ENCOUNTER — Other Ambulatory Visit: Payer: Self-pay

## 2023-10-05 ENCOUNTER — Other Ambulatory Visit (HOSPITAL_COMMUNITY): Payer: Self-pay

## 2023-10-06 ENCOUNTER — Other Ambulatory Visit: Payer: Self-pay

## 2023-10-09 ENCOUNTER — Other Ambulatory Visit (HOSPITAL_COMMUNITY): Payer: Self-pay

## 2023-10-09 MED ORDER — FLUZONE HIGH-DOSE 0.5 ML IM SUSY
0.5000 mL | PREFILLED_SYRINGE | Freq: Once | INTRAMUSCULAR | 0 refills | Status: AC
  Filled 2023-10-09: qty 0.5, 1d supply, fill #0

## 2023-10-09 MED ORDER — COMIRNATY 30 MCG/0.3ML IM SUSY
0.3000 mL | PREFILLED_SYRINGE | Freq: Once | INTRAMUSCULAR | 0 refills | Status: AC
  Filled 2023-10-09: qty 0.3, 1d supply, fill #0

## 2023-10-14 ENCOUNTER — Other Ambulatory Visit: Payer: Self-pay

## 2023-10-14 ENCOUNTER — Other Ambulatory Visit (HOSPITAL_COMMUNITY): Payer: Self-pay

## 2023-10-15 ENCOUNTER — Other Ambulatory Visit (HOSPITAL_COMMUNITY): Payer: Self-pay

## 2023-10-15 ENCOUNTER — Other Ambulatory Visit: Payer: Self-pay

## 2023-10-15 MED ORDER — NYSTATIN-TRIAMCINOLONE 100000-0.1 UNIT/GM-% EX CREA
TOPICAL_CREAM | CUTANEOUS | 3 refills | Status: AC
  Filled 2023-10-15: qty 90, 30d supply, fill #0
  Filled 2023-11-20: qty 90, 30d supply, fill #1
  Filled 2023-12-19: qty 90, 30d supply, fill #2
  Filled 2024-02-05: qty 90, 30d supply, fill #3

## 2023-10-16 ENCOUNTER — Other Ambulatory Visit (HOSPITAL_COMMUNITY): Payer: Self-pay

## 2023-10-17 ENCOUNTER — Other Ambulatory Visit (HOSPITAL_COMMUNITY): Payer: Self-pay

## 2023-10-17 MED ORDER — LOSARTAN POTASSIUM 100 MG PO TABS
100.0000 mg | ORAL_TABLET | Freq: Every morning | ORAL | 3 refills | Status: AC
  Filled 2023-10-17: qty 90, 90d supply, fill #0
  Filled 2024-01-12: qty 90, 90d supply, fill #1

## 2023-10-25 ENCOUNTER — Other Ambulatory Visit (HOSPITAL_COMMUNITY): Payer: Self-pay

## 2023-10-27 ENCOUNTER — Other Ambulatory Visit: Payer: Self-pay

## 2023-10-28 ENCOUNTER — Other Ambulatory Visit (HOSPITAL_COMMUNITY): Payer: Self-pay

## 2023-10-28 MED ORDER — LEVOTHYROXINE SODIUM 75 MCG PO TABS
75.0000 ug | ORAL_TABLET | Freq: Every morning | ORAL | 3 refills | Status: AC
  Filled 2023-10-28: qty 90, 90d supply, fill #0
  Filled 2024-02-05: qty 90, 90d supply, fill #1

## 2023-10-29 DIAGNOSIS — H5203 Hypermetropia, bilateral: Secondary | ICD-10-CM | POA: Diagnosis not present

## 2023-10-29 DIAGNOSIS — Z961 Presence of intraocular lens: Secondary | ICD-10-CM | POA: Diagnosis not present

## 2023-10-29 DIAGNOSIS — E119 Type 2 diabetes mellitus without complications: Secondary | ICD-10-CM | POA: Diagnosis not present

## 2023-11-10 ENCOUNTER — Other Ambulatory Visit (HOSPITAL_COMMUNITY): Payer: Self-pay

## 2023-11-20 ENCOUNTER — Other Ambulatory Visit (HOSPITAL_COMMUNITY): Payer: Self-pay

## 2023-11-20 ENCOUNTER — Other Ambulatory Visit: Payer: Self-pay

## 2023-11-20 MED ORDER — JARDIANCE 25 MG PO TABS
25.0000 mg | ORAL_TABLET | Freq: Every day | ORAL | 3 refills | Status: AC
  Filled 2023-11-20: qty 90, 90d supply, fill #0

## 2023-11-21 ENCOUNTER — Other Ambulatory Visit: Payer: Self-pay

## 2023-11-22 ENCOUNTER — Other Ambulatory Visit (HOSPITAL_COMMUNITY): Payer: Self-pay

## 2023-12-19 ENCOUNTER — Other Ambulatory Visit: Payer: Self-pay

## 2024-02-05 ENCOUNTER — Other Ambulatory Visit: Payer: Self-pay

## 2024-02-05 ENCOUNTER — Other Ambulatory Visit (HOSPITAL_COMMUNITY): Payer: Self-pay

## 2024-02-05 MED ORDER — MOUNJARO 10 MG/0.5ML ~~LOC~~ SOAJ
10.0000 mg | SUBCUTANEOUS | 0 refills | Status: AC
  Filled 2024-02-05: qty 2, 28d supply, fill #0

## 2024-02-06 ENCOUNTER — Other Ambulatory Visit (HOSPITAL_COMMUNITY): Payer: Self-pay

## 2024-02-10 ENCOUNTER — Other Ambulatory Visit (HOSPITAL_COMMUNITY): Payer: Self-pay

## 2024-02-10 MED ORDER — MOUNJARO 10 MG/0.5ML ~~LOC~~ SOAJ
10.0000 mg | SUBCUTANEOUS | 0 refills | Status: AC
  Filled 2024-02-10 – 2024-02-12 (×3): qty 6, 84d supply, fill #0

## 2024-02-10 MED ORDER — LEVOTHYROXINE SODIUM 75 MCG PO TABS: 75.0000 ug | ORAL_TABLET | Freq: Every morning | ORAL | 2 refills | Status: AC

## 2024-02-11 ENCOUNTER — Other Ambulatory Visit (HOSPITAL_COMMUNITY): Payer: Self-pay

## 2024-02-11 ENCOUNTER — Other Ambulatory Visit: Payer: Self-pay

## 2024-02-12 ENCOUNTER — Other Ambulatory Visit (HOSPITAL_COMMUNITY): Payer: Self-pay

## 2024-02-16 ENCOUNTER — Other Ambulatory Visit (HOSPITAL_COMMUNITY): Payer: Self-pay
